# Patient Record
Sex: Male | Born: 1945 | Race: White | Hispanic: No | Marital: Single | Smoking: Former smoker
Health system: Southern US, Community
[De-identification: ages and names within clinical notes are randomized; demographics above are authoritative.]

## PROBLEM LIST (undated history)

## (undated) DIAGNOSIS — R1310 Dysphagia, unspecified: Secondary | ICD-10-CM

## (undated) DIAGNOSIS — C801 Malignant (primary) neoplasm, unspecified: Secondary | ICD-10-CM

## (undated) DIAGNOSIS — M1990 Unspecified osteoarthritis, unspecified site: Secondary | ICD-10-CM

## (undated) DIAGNOSIS — D649 Anemia, unspecified: Secondary | ICD-10-CM

## (undated) DIAGNOSIS — F419 Anxiety disorder, unspecified: Secondary | ICD-10-CM

## (undated) DIAGNOSIS — F32A Depression, unspecified: Secondary | ICD-10-CM

## (undated) DIAGNOSIS — F329 Major depressive disorder, single episode, unspecified: Secondary | ICD-10-CM

## (undated) HISTORY — DX: Anemia, unspecified: D64.9

## (undated) HISTORY — PX: WRIST FRACTURE SURGERY: SHX121

## (undated) HISTORY — PX: HEMORRHOID SURGERY: SHX153

## (undated) HISTORY — DX: Anxiety disorder, unspecified: F41.9

## (undated) HISTORY — PX: CT RADIATION THERAPY GUIDE: HXRAD513

## (undated) HISTORY — DX: Malignant (primary) neoplasm, unspecified: C80.1

## (undated) HISTORY — DX: Depression, unspecified: F32.A

## (undated) HISTORY — DX: Unspecified osteoarthritis, unspecified site: M19.90

## (undated) HISTORY — DX: Dysphagia, unspecified: R13.10

## (undated) HISTORY — PX: TONSILLECTOMY: SUR1361

## (undated) HISTORY — PX: LUNG REMOVAL, PARTIAL: SHX233

## (undated) HISTORY — DX: Major depressive disorder, single episode, unspecified: F32.9

## (undated) HISTORY — PX: HEAD & NECK WOUND REPAIR / CLOSURE: SUR1142

---
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
## 2012-11-19 DIAGNOSIS — N529 Male erectile dysfunction, unspecified: Secondary | ICD-10-CM | POA: Insufficient documentation

## 2012-11-19 DIAGNOSIS — F32A Depression, unspecified: Secondary | ICD-10-CM | POA: Insufficient documentation

## 2012-11-19 DIAGNOSIS — I10 Essential (primary) hypertension: Secondary | ICD-10-CM | POA: Insufficient documentation

## 2012-11-19 DIAGNOSIS — F1020 Alcohol dependence, uncomplicated: Secondary | ICD-10-CM | POA: Insufficient documentation

## 2012-11-19 DIAGNOSIS — J449 Chronic obstructive pulmonary disease, unspecified: Secondary | ICD-10-CM | POA: Insufficient documentation

## 2012-11-19 DIAGNOSIS — F329 Major depressive disorder, single episode, unspecified: Secondary | ICD-10-CM | POA: Insufficient documentation

## 2012-11-19 DIAGNOSIS — E039 Hypothyroidism, unspecified: Secondary | ICD-10-CM | POA: Insufficient documentation

## 2012-12-31 ENCOUNTER — Emergency Department: Payer: Self-pay | Admitting: Emergency Medicine

## 2012-12-31 LAB — CBC
HCT: 43 % (ref 40.0–52.0)
MCH: 28.8 pg (ref 26.0–34.0)
MCHC: 34.1 g/dL (ref 32.0–36.0)
MCV: 84 fL (ref 80–100)
RBC: 5.09 10*6/uL (ref 4.40–5.90)

## 2012-12-31 LAB — URINALYSIS, COMPLETE
Hyaline Cast: 4
Ketone: NEGATIVE
Leukocyte Esterase: NEGATIVE
Nitrite: NEGATIVE
Ph: 6 (ref 4.5–8.0)
RBC,UR: 1 /HPF (ref 0–5)

## 2012-12-31 LAB — COMPREHENSIVE METABOLIC PANEL
Alkaline Phosphatase: 248 U/L — ABNORMAL HIGH (ref 50–136)
Anion Gap: 8 (ref 7–16)
BUN: 6 mg/dL — ABNORMAL LOW (ref 7–18)
Co2: 25 mmol/L (ref 21–32)
Creatinine: 0.72 mg/dL (ref 0.60–1.30)
Glucose: 114 mg/dL — ABNORMAL HIGH (ref 65–99)
Osmolality: 259 (ref 275–301)
SGOT(AST): 93 U/L — ABNORMAL HIGH (ref 15–37)
SGPT (ALT): 81 U/L — ABNORMAL HIGH (ref 12–78)
Total Protein: 8.1 g/dL (ref 6.4–8.2)

## 2013-01-10 ENCOUNTER — Encounter: Payer: Self-pay | Admitting: Internal Medicine

## 2013-01-13 ENCOUNTER — Encounter: Payer: Self-pay | Admitting: Internal Medicine

## 2013-02-01 ENCOUNTER — Emergency Department: Payer: Self-pay | Admitting: Emergency Medicine

## 2013-02-01 ENCOUNTER — Ambulatory Visit: Payer: Self-pay | Admitting: Unknown Physician Specialty

## 2013-02-01 LAB — URINALYSIS, COMPLETE
Ketone: NEGATIVE
Nitrite: NEGATIVE
Specific Gravity: 1.009 (ref 1.003–1.030)
Squamous Epithelial: NONE SEEN
WBC UR: 183 /HPF (ref 0–5)

## 2013-02-01 LAB — TROPONIN I
Troponin-I: <0.02 ng/mL
Troponin-I: <0.02 ng/mL

## 2013-02-01 LAB — COMPREHENSIVE METABOLIC PANEL
Albumin: 3.5 g/dL (ref 3.4–5.0)
Anion Gap: 6 — ABNORMAL LOW (ref 7–16)
BUN: 10 mg/dL (ref 7–18)
Bilirubin,Total: 0.5 mg/dL (ref 0.2–1.0)
Calcium, Total: 9.4 mg/dL (ref 8.5–10.1)
Chloride: 95 mmol/L — ABNORMAL LOW (ref 98–107)
Creatinine: 0.95 mg/dL (ref 0.60–1.30)
Glucose: 115 mg/dL — ABNORMAL HIGH (ref 65–99)
Osmolality: 259 (ref 275–301)
Potassium: 3.6 mmol/L (ref 3.5–5.1)
SGPT (ALT): 42 U/L (ref 12–78)
Sodium: 129 mmol/L — ABNORMAL LOW (ref 136–145)
Total Protein: 7.1 g/dL (ref 6.4–8.2)

## 2013-02-01 LAB — CBC
HCT: 37.1 % — ABNORMAL LOW (ref 40.0–52.0)
HGB: 12.9 g/dL — ABNORMAL LOW (ref 13.0–18.0)
MCH: 29 pg (ref 26.0–34.0)
Platelet: 245 10*3/uL (ref 150–440)
RBC: 4.44 10*6/uL (ref 4.40–5.90)
RDW: 17.5 % — ABNORMAL HIGH (ref 11.5–14.5)
WBC: 10.2 10*3/uL (ref 3.8–10.6)

## 2013-02-03 LAB — URINE CULTURE

## 2013-02-07 ENCOUNTER — Ambulatory Visit: Payer: Self-pay | Admitting: Gastroenterology

## 2013-02-13 ENCOUNTER — Encounter: Payer: Self-pay | Admitting: Internal Medicine

## 2013-03-14 DIAGNOSIS — K7460 Unspecified cirrhosis of liver: Secondary | ICD-10-CM | POA: Insufficient documentation

## 2013-03-14 DIAGNOSIS — K769 Liver disease, unspecified: Secondary | ICD-10-CM | POA: Insufficient documentation

## 2013-03-14 DIAGNOSIS — B182 Chronic viral hepatitis C: Secondary | ICD-10-CM | POA: Insufficient documentation

## 2013-04-21 DIAGNOSIS — C3490 Malignant neoplasm of unspecified part of unspecified bronchus or lung: Secondary | ICD-10-CM | POA: Insufficient documentation

## 2013-05-05 DIAGNOSIS — C228 Malignant neoplasm of liver, primary, unspecified as to type: Secondary | ICD-10-CM | POA: Insufficient documentation

## 2013-05-05 DIAGNOSIS — C220 Liver cell carcinoma: Secondary | ICD-10-CM | POA: Insufficient documentation

## 2013-07-27 LAB — BASIC METABOLIC PANEL
Anion Gap: 5 — ABNORMAL LOW (ref 7–16)
BUN: 20 mg/dL — AB (ref 7–18)
CHLORIDE: 91 mmol/L — AB (ref 98–107)
Calcium, Total: 8.6 mg/dL (ref 8.5–10.1)
Co2: 27 mmol/L (ref 21–32)
Creatinine: 1.22 mg/dL (ref 0.60–1.30)
EGFR (African American): >60
EGFR (Non-African Amer.): >60
Glucose: 111 mg/dL — ABNORMAL HIGH (ref 65–99)
Osmolality: 251 (ref 275–301)
Potassium: 3.9 mmol/L (ref 3.5–5.1)
SODIUM: 123 mmol/L — AB (ref 136–145)

## 2013-07-27 LAB — CBC WITH DIFFERENTIAL/PLATELET
BASOS PCT: 0.8 %
Basophil #: 0.1 10*3/uL (ref 0.0–0.1)
EOS ABS: 0.1 10*3/uL (ref 0.0–0.7)
EOS PCT: 0.9 %
HCT: 38.1 % — ABNORMAL LOW (ref 40.0–52.0)
HGB: 12.8 g/dL — AB (ref 13.0–18.0)
LYMPHS ABS: 0.9 10*3/uL — AB (ref 1.0–3.6)
Lymphocyte %: 11.1 %
MCH: 27.5 pg (ref 26.0–34.0)
MCHC: 33.5 g/dL (ref 32.0–36.0)
MCV: 82 fL (ref 80–100)
Monocyte #: 0.8 x10 3/mm (ref 0.2–1.0)
Monocyte %: 10.4 %
NEUTROS ABS: 6.3 10*3/uL (ref 1.4–6.5)
Neutrophil %: 76.8 %
Platelet: 148 10*3/uL — ABNORMAL LOW (ref 150–440)
RBC: 4.64 10*6/uL (ref 4.40–5.90)
RDW: 17.6 % — ABNORMAL HIGH (ref 11.5–14.5)
WBC: 8.2 10*3/uL (ref 3.8–10.6)

## 2013-07-27 LAB — TROPONIN I

## 2013-07-28 ENCOUNTER — Inpatient Hospital Stay: Payer: Self-pay | Admitting: Internal Medicine

## 2013-07-28 LAB — SODIUM: Sodium: 129 mmol/L — ABNORMAL LOW (ref 136–145)

## 2013-07-28 LAB — TSH: THYROID STIMULATING HORM: 4.31 u[IU]/mL

## 2013-07-28 LAB — SODIUM, URINE, RANDOM: SODIUM, URINE RANDOM: 19 mmol/L (ref 20–110)

## 2013-07-28 LAB — HEMOGLOBIN: HGB: 11.8 g/dL — ABNORMAL LOW (ref 13.0–18.0)

## 2013-07-28 LAB — OSMOLALITY, URINE: Osmolality: 135 mOsm/kg

## 2013-09-11 ENCOUNTER — Emergency Department: Payer: Self-pay | Admitting: Emergency Medicine

## 2013-11-14 ENCOUNTER — Ambulatory Visit: Payer: Self-pay | Admitting: Gastroenterology

## 2013-11-29 ENCOUNTER — Ambulatory Visit: Payer: Self-pay | Admitting: Urology

## 2013-12-20 ENCOUNTER — Ambulatory Visit: Payer: Self-pay | Admitting: Gastroenterology

## 2013-12-26 ENCOUNTER — Ambulatory Visit: Payer: Self-pay | Admitting: Gastroenterology

## 2014-01-02 ENCOUNTER — Ambulatory Visit: Payer: Self-pay | Admitting: Pain Medicine

## 2014-01-02 LAB — BASIC METABOLIC PANEL
Anion Gap: 6 — ABNORMAL LOW (ref 7–16)
BUN: 5 mg/dL — AB (ref 7–18)
CALCIUM: 8.5 mg/dL (ref 8.5–10.1)
CO2: 28 mmol/L (ref 21–32)
Chloride: 103 mmol/L (ref 98–107)
Creatinine: 0.79 mg/dL (ref 0.60–1.30)
EGFR (African American): >60
EGFR (Non-African Amer.): >60
Glucose: 100 mg/dL — ABNORMAL HIGH (ref 65–99)
Osmolality: 271 (ref 275–301)
Potassium: 3.7 mmol/L (ref 3.5–5.1)
SODIUM: 137 mmol/L (ref 136–145)

## 2014-01-02 LAB — HEPATIC FUNCTION PANEL A (ARMC)
ALT: 37 U/L (ref 12–78)
AST: 41 U/L — AB (ref 15–37)
Albumin: 3.6 g/dL (ref 3.4–5.0)
Alkaline Phosphatase: 82 U/L
Bilirubin, Direct: 0.2 mg/dL (ref 0.00–0.20)
Bilirubin,Total: 0.5 mg/dL (ref 0.2–1.0)
Total Protein: 7.7 g/dL (ref 6.4–8.2)

## 2014-01-02 LAB — MAGNESIUM: Magnesium: 1.8 mg/dL

## 2014-01-02 LAB — SEDIMENTATION RATE: ERYTHROCYTE SED RATE: 18 mm/h (ref 0–20)

## 2014-01-16 ENCOUNTER — Ambulatory Visit: Payer: Self-pay | Admitting: Pain Medicine

## 2014-01-29 ENCOUNTER — Ambulatory Visit: Payer: Self-pay | Admitting: Pain Medicine

## 2014-02-15 ENCOUNTER — Ambulatory Visit: Payer: Self-pay | Admitting: Pain Medicine

## 2014-02-18 DIAGNOSIS — B182 Chronic viral hepatitis C: Secondary | ICD-10-CM | POA: Insufficient documentation

## 2014-02-18 DIAGNOSIS — K5730 Diverticulosis of large intestine without perforation or abscess without bleeding: Secondary | ICD-10-CM | POA: Insufficient documentation

## 2014-03-02 ENCOUNTER — Ambulatory Visit: Payer: Self-pay | Admitting: Pain Medicine

## 2014-03-27 ENCOUNTER — Ambulatory Visit: Payer: Self-pay | Admitting: Pain Medicine

## 2014-03-29 ENCOUNTER — Ambulatory Visit: Payer: Self-pay | Admitting: Urology

## 2014-03-29 LAB — CBC WITH DIFFERENTIAL/PLATELET
BASOS ABS: 0.1 10*3/uL (ref 0.0–0.1)
Basophil %: 1.3 %
EOS PCT: 2.5 %
Eosinophil #: 0.2 10*3/uL (ref 0.0–0.7)
HCT: 36.9 % — ABNORMAL LOW (ref 40.0–52.0)
HGB: 11.5 g/dL — AB (ref 13.0–18.0)
LYMPHS ABS: 0.5 10*3/uL — AB (ref 1.0–3.6)
LYMPHS PCT: 7.7 %
MCH: 26.3 pg (ref 26.0–34.0)
MCHC: 31.2 g/dL — ABNORMAL LOW (ref 32.0–36.0)
MCV: 84 fL (ref 80–100)
Monocyte #: 0.7 x10 3/mm (ref 0.2–1.0)
Monocyte %: 10.5 %
NEUTROS PCT: 78 %
Neutrophil #: 5.2 10*3/uL (ref 1.4–6.5)
PLATELETS: 292 10*3/uL (ref 150–440)
RBC: 4.38 10*6/uL — ABNORMAL LOW (ref 4.40–5.90)
RDW: 15.4 % — ABNORMAL HIGH (ref 11.5–14.5)
WBC: 6.7 10*3/uL (ref 3.8–10.6)

## 2014-03-29 LAB — BASIC METABOLIC PANEL
Anion Gap: 7 (ref 7–16)
BUN: 5 mg/dL — ABNORMAL LOW (ref 7–18)
CALCIUM: 8.8 mg/dL (ref 8.5–10.1)
CHLORIDE: 102 mmol/L (ref 98–107)
CO2: 28 mmol/L (ref 21–32)
Creatinine: 0.77 mg/dL (ref 0.60–1.30)
GLUCOSE: 108 mg/dL — AB (ref 65–99)
Osmolality: 272 (ref 275–301)
Potassium: 4.4 mmol/L (ref 3.5–5.1)
Sodium: 137 mmol/L (ref 136–145)

## 2014-04-19 ENCOUNTER — Ambulatory Visit: Payer: Self-pay | Admitting: Pain Medicine

## 2014-06-21 ENCOUNTER — Ambulatory Visit: Payer: Self-pay | Admitting: Family Medicine

## 2014-08-29 IMAGING — CT CT ABDOMEN AND PELVIS WITHOUT AND WITH CONTRAST
2 of 10 series · 10 of 46 positions shown, 16 images · IV contrast (isovue)
Comparison: Abdominal MRI 02/07/2013, abdominal ultrasound
02/01/2013 and CT urogram 01/01/2013.

CLINICAL DATA: One episode of gross hematuria 2 or 3 weeks ago.
History of prostate, lung (and liver? ) cancer.

EXAM:
CT ABDOMEN AND PELVIS WITHOUT AND WITH CONTRAST
TECHNIQUE: Multidetector CT imaging of the abdomen and pelvis was performed
following the standard protocol before and following the bolus
administration of intravenous contrast.
CONTRAST:  100 ml Isovue 370.

[Series 7: cor hematuria > 45 wo · coronal · 0.79mm/px · 2 of 153 slices shown, 3 images]
[im 51/153  soft-tissue]
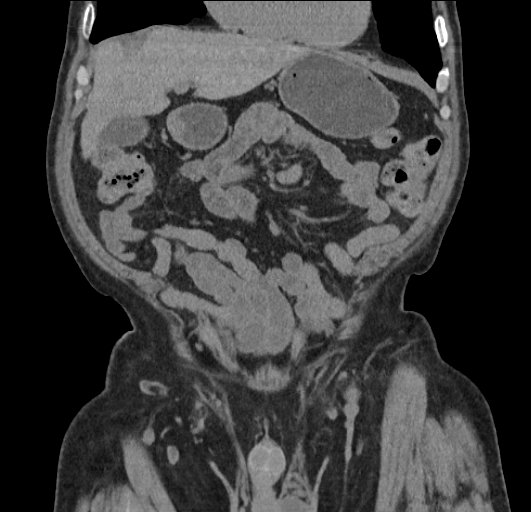
[im 51/153  bone]
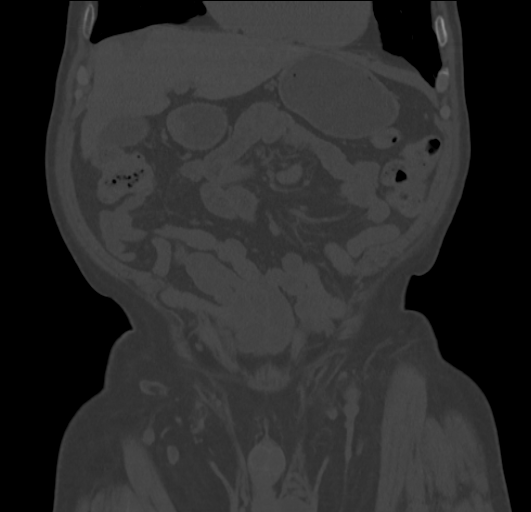
[im 102/153  soft-tissue]
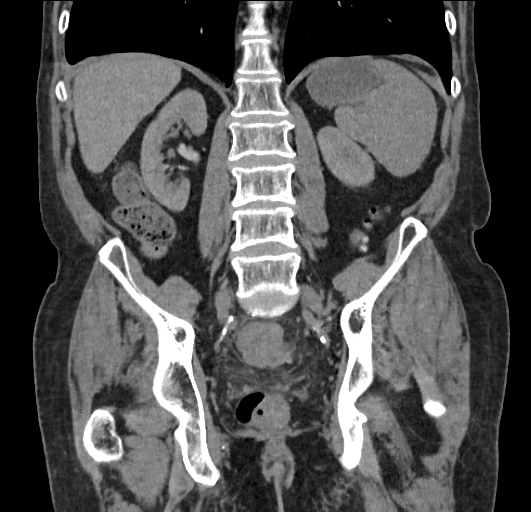

[Series 12: delay · axial · delayed · 0.87mm/px · z∈[-954,-620]mm · 8 of 87 slices shown, 13 images]
[im 10/87  soft-tissue]
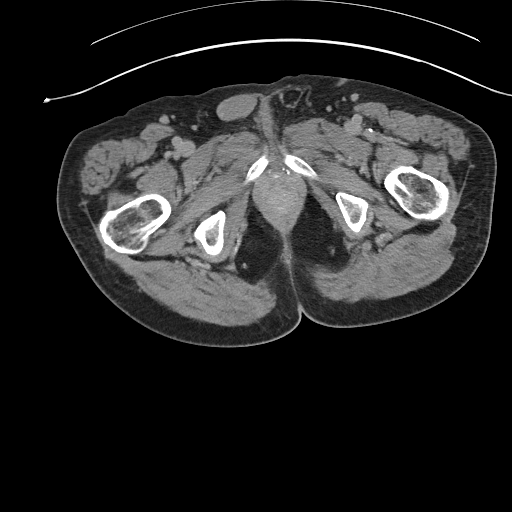
[im 10/87  bone]
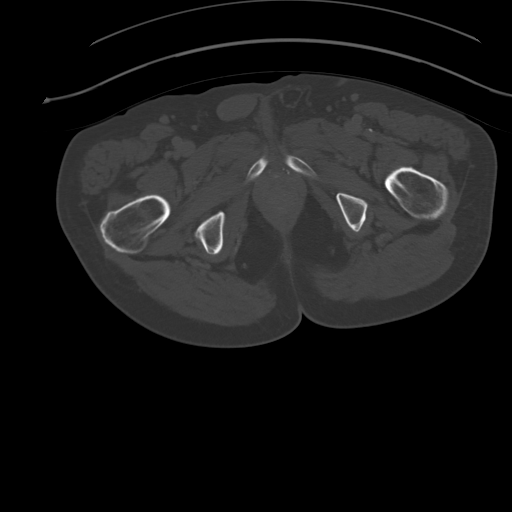
[im 20/87  soft-tissue]
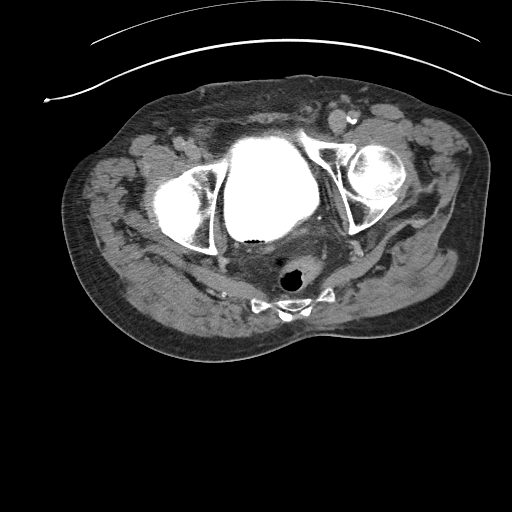
[im 29/87  soft-tissue]
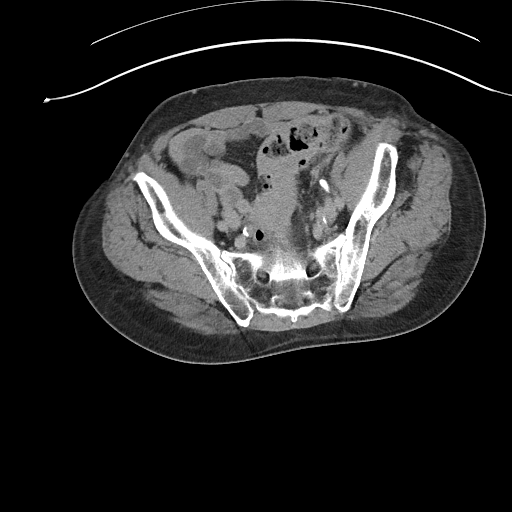
[im 39/87  soft-tissue]
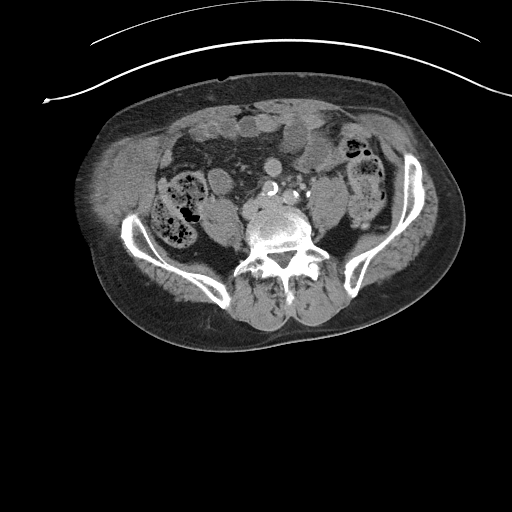
[im 48/87  soft-tissue]
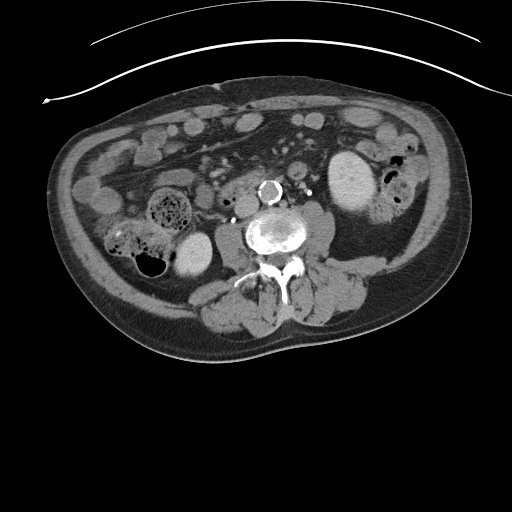
[im 48/87  lung]
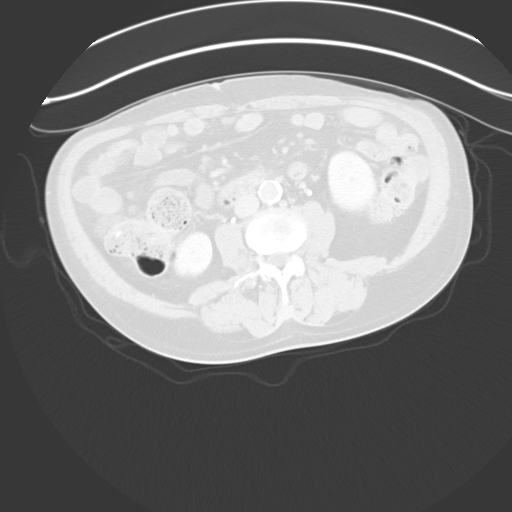
[im 58/87  soft-tissue]
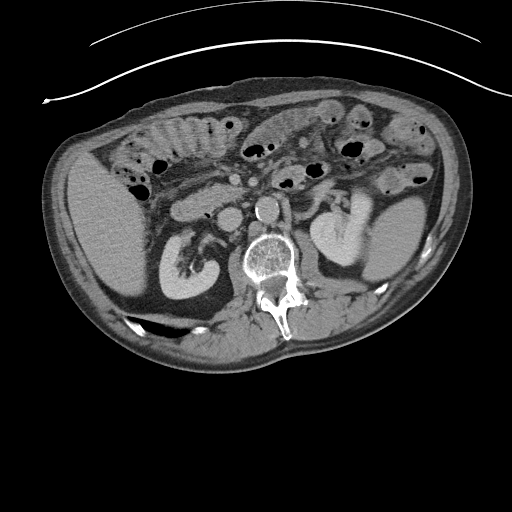
[im 58/87  lung]
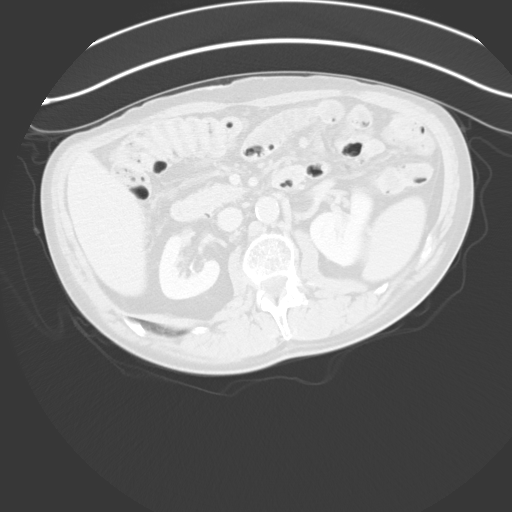
[im 67/87  soft-tissue]
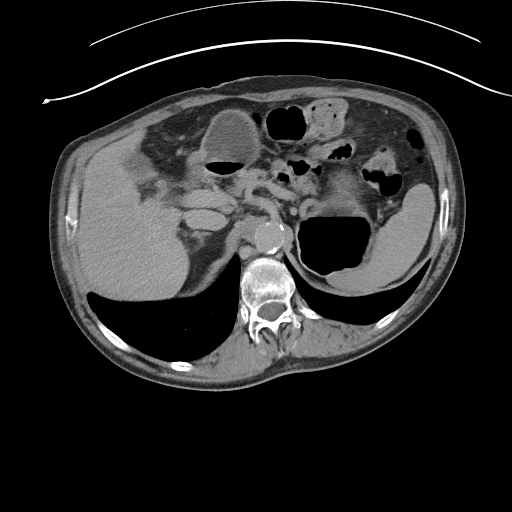
[im 67/87  lung]
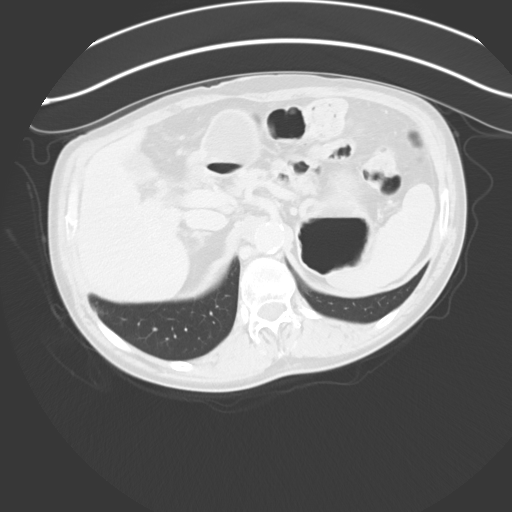
[im 77/87  soft-tissue]
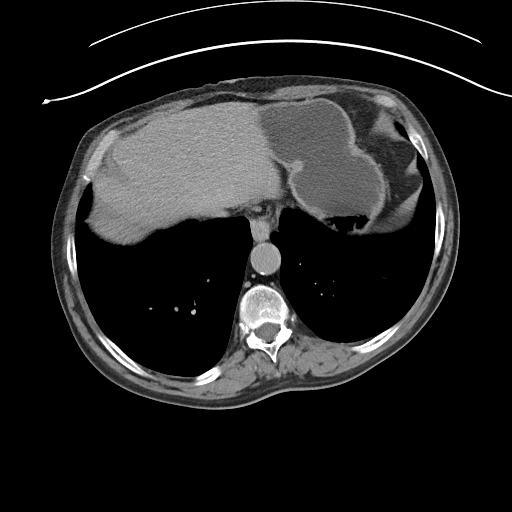
[im 77/87  lung]
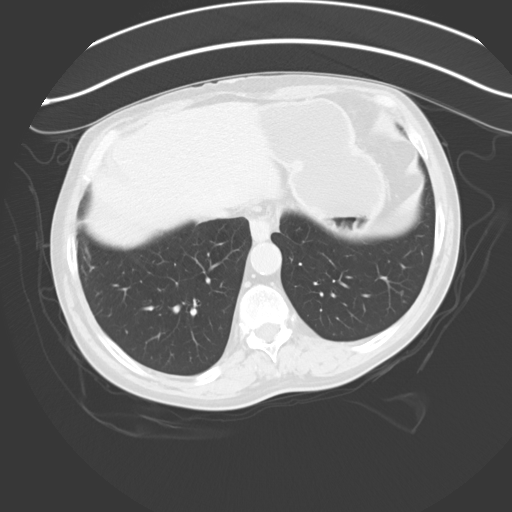

[10 of 46 positions shown; findings below may reference images not displayed]

FINDINGS: Lung bases: There is stable linear scarring laterally at the right
lung base and minimal subpleural nodularity in the left lower lobe.
No significant pleural or pericardial effusion is present.

Kidneys / Ureters / Bladder: Pre-contrast images demonstrate no
renal, ureteral or bladder calculi. The pre contrast images
demonstrate symmetric high density within the renal collecting
systems, ureters and bladder consistent with contrast material,
possibly from a test dose. Delayed images resultant segmental
visualization of the ureters which are normal in caliber. No upper
tract urothelial lesions are identified. Air is present within the
lumen of the urinary bladder. There is irregular thickening of the
superior walls of the bladder. There is suspicion of a colovesical
fistula between the sigmoid colon and bladder dome, best seen on the
reformatted images.

Other: Low-density lesion centrally in the right hepatic lobe with
peripheral enhancement has decreased in size compared with the prior
MRI, measuring approximately 2.4 x 1.6 cm on image 14. This appears
partially calcified on the precontrast images, and there is
surrounding low density within the liver. No new or enlarging liver
lesions are identified.

The gallbladder, biliary system, pancreas, spleen and adrenal glands
appear stable without significant findings. Aortoiliac
atherosclerosis and postsurgical changes related to prior
prostatectomy are noted. There is no abdominal pelvic
lymphadenopathy.

The stomach, small bowel, appendix and proximal colon appear normal.
Compared with the CT performed last [REDACTED], there is progressive
diffuse thickening of the walls of the sigmoid colon associated with
pericolonic inflammatory changes. As noted above, there appears to
be a small amount of extraluminal air inferiorly suggesting a
colovesical fistula. There is no free intraperitoneal air or pelvic
abscess. There is no evidence of bowel obstruction.

Bones / Musculoskeletal: No acute or worrisome osseous findings.
IMPRESSION: 1. Progressive chronic sigmoid colon wall thickening with
surrounding inflammatory change most consistent with chronic or
recurrent sigmoid diverticulitis. There is now adjacent thickening
of the walls of the dome of the urinary bladder and air in the
bladder lumen worrisome for development of a colovesical fistula.
Underlying colonic or bladder neoplasm cannot be excluded but is not
strongly suspected.
2. These findings presumably contribute to the patient's hematuria.
No other explanation for hematuria identified.
3. The kidneys in ureters appear normal.
4. Previously demonstrated central hepatic lesion appears slightly
smaller, potentially a treated metastasis. No new liver lesions
identified.

## 2014-10-02 IMAGING — CR LEFT WRIST - COMPLETE 3+ VIEW
1 series · 4 of 4 positions shown · non-contrast
Comparison: None.

CLINICAL DATA: Chronic left wrist pain.

EXAM:
LEFT WRIST - COMPLETE 3+ VIEW

[Series 4: x wrist pa left · 0.14mm/px · 4 of 4 slices shown]
[im 1/4]
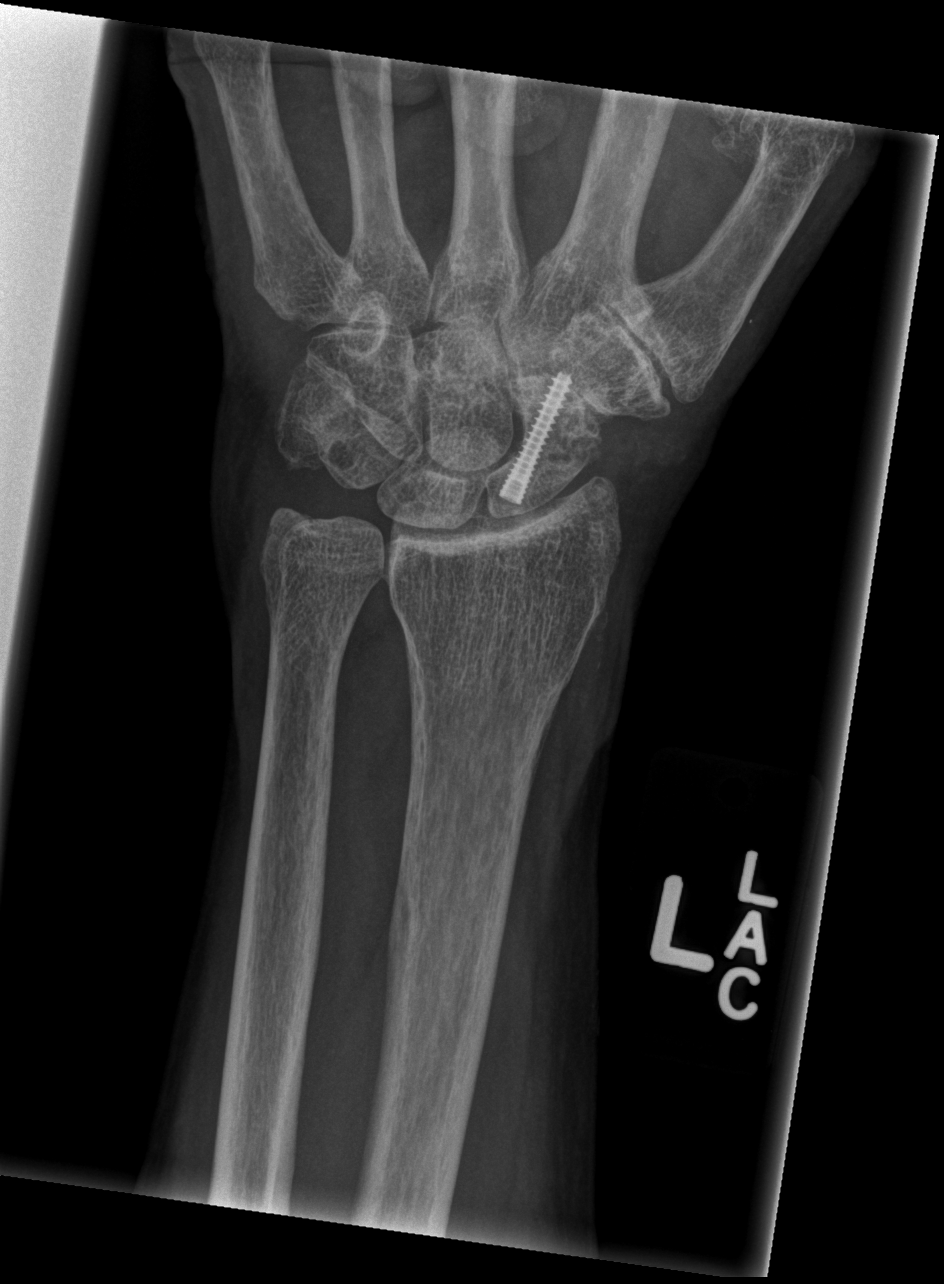
[im 2/4]
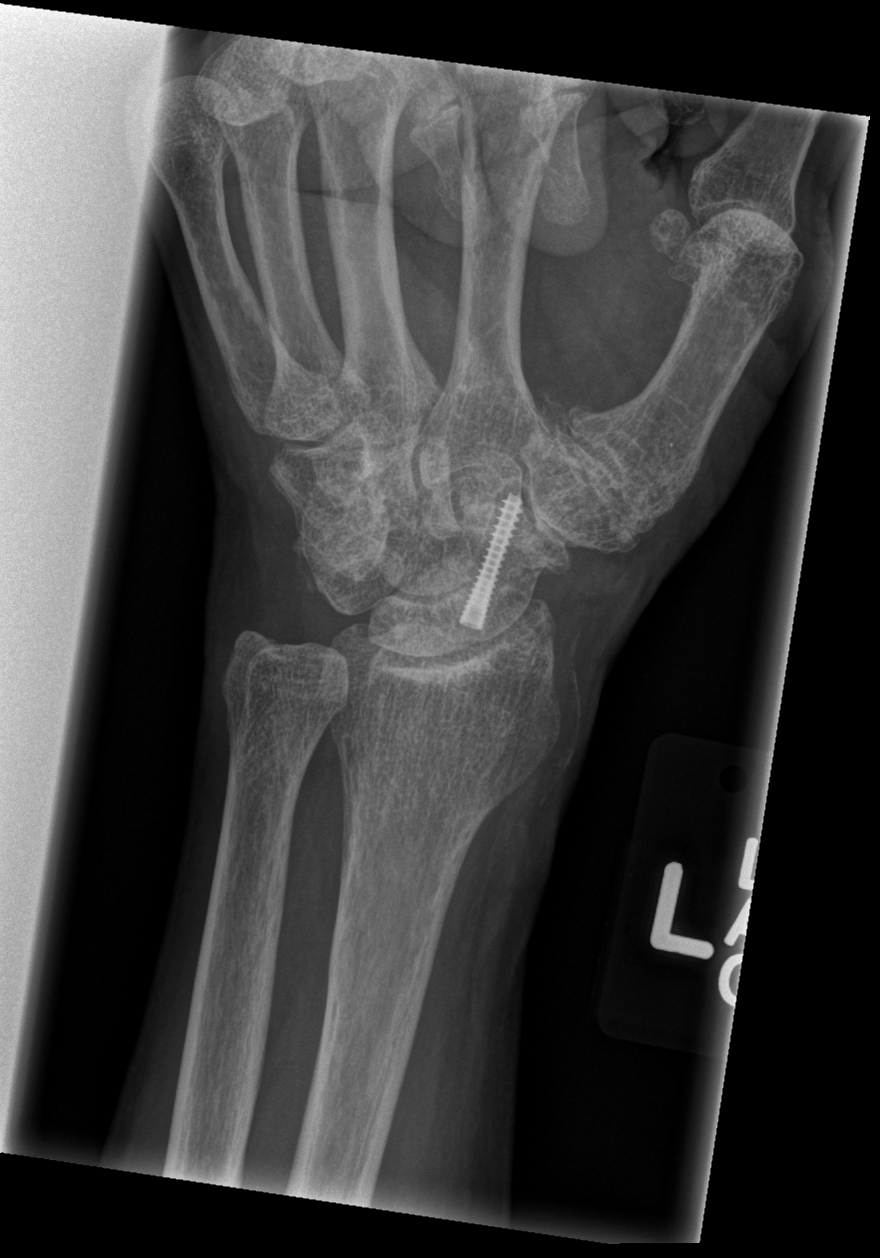
[im 3/4]
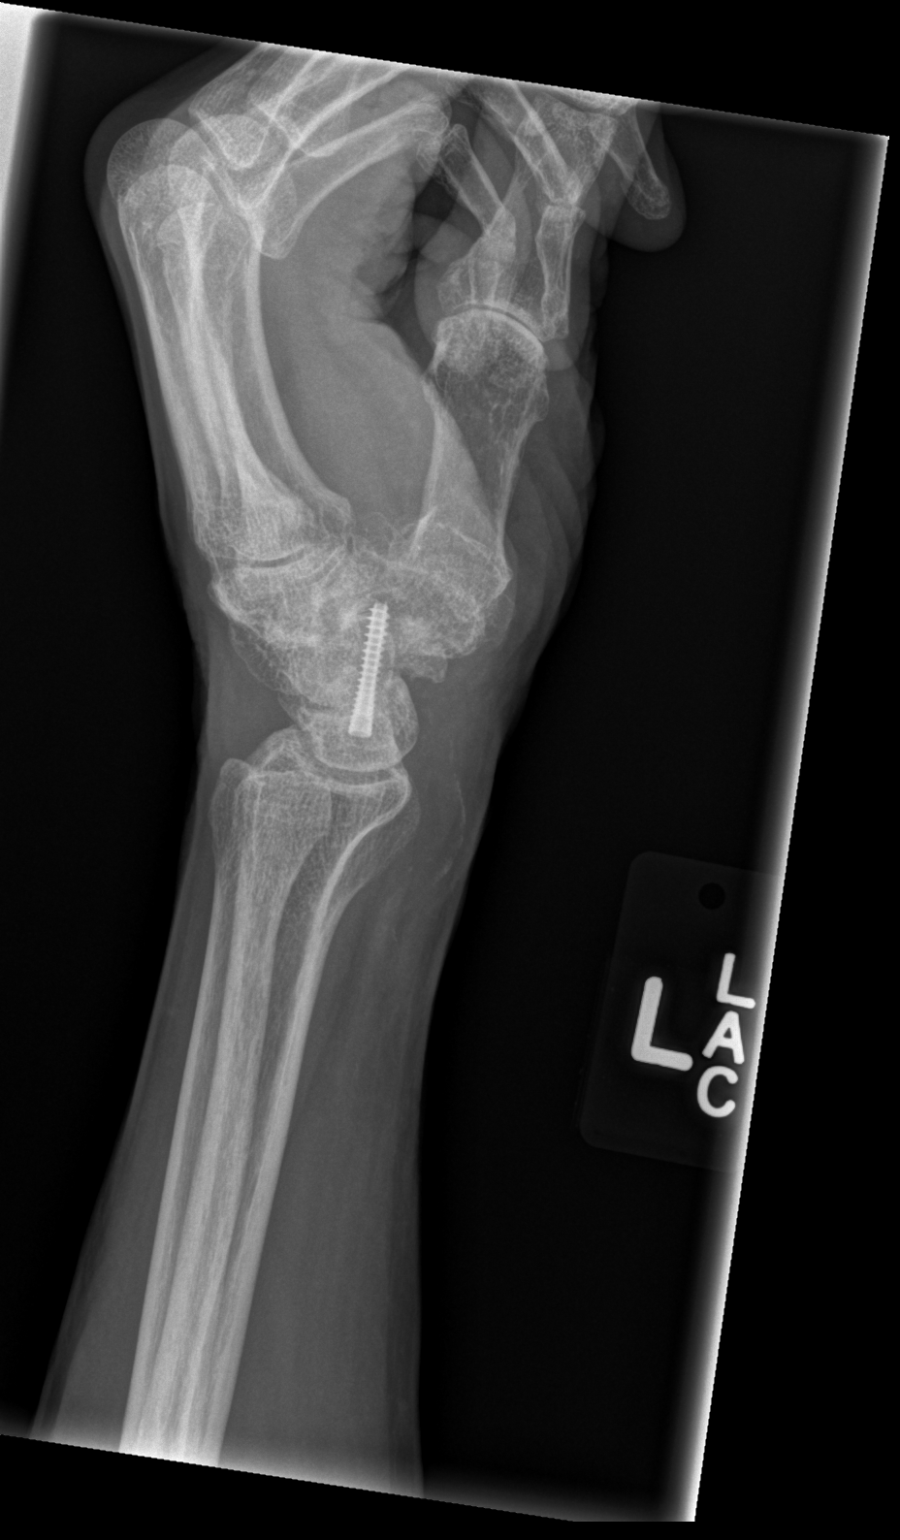
[im 4/4]
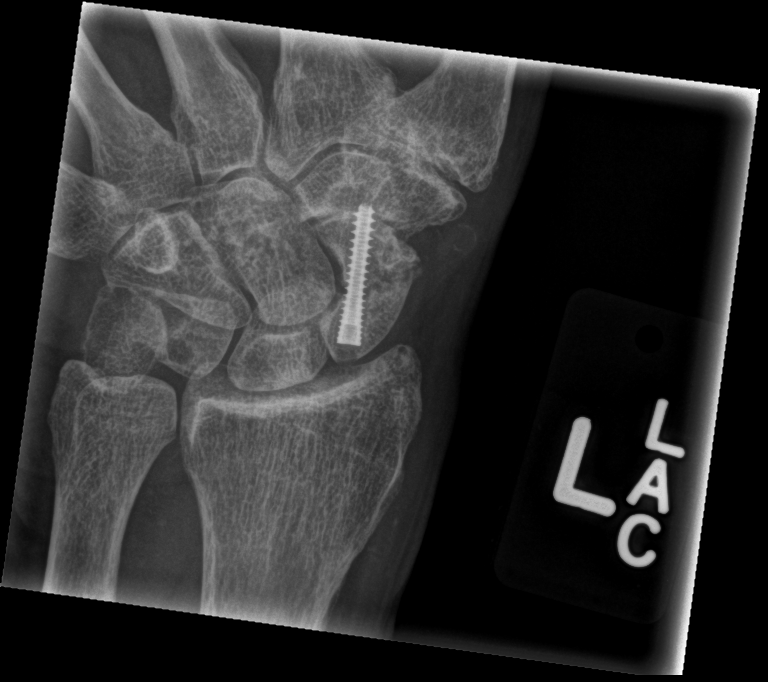

[4 of 4 positions shown; findings below may reference images not displayed]

FINDINGS: No acute fracture or dislocation is noted. Surgical screw is noted
in the scaphoid bone. Narrowing and osteophyte formation of the
first carpometacarpal joint is noted consistent with osteoarthritis.
No soft tissue abnormality is noted.
IMPRESSION: Osteoarthritis of the first carpometacarpal joint. No acute fracture
or dislocation is noted.

## 2014-10-06 NOTE — Discharge Summary (Signed)
PATIENT NAME:  Tyrone Dominguez, OZBURN MR#:  [id number] DATE OF BIRTH:  May 05, 1946  DATE OF ADMISSION:  07/28/2013 DATE OF DISCHARGE:  07/28/2013  ADMITTING DIAGNOSIS: Hyponatremia.   DISCHARGE DIAGNOSES: 1.  Dizziness, resolved.  2.  Hyponatremia, likely excess water intake  as well as possibly hydrochlorothiazide-related, better with normal saline solution supplementation.  3.  Dyspnea resolved.  4.  History of hypothyroidism.  5.  Hepatocellular carcinoma and lung cancer, status post wedge resection in the past.   DISCHARGE CONDITION: Stable.   DISCHARGE MEDICATIONS: The patient is to continue l-thyroxine 25 mcg p.o. daily, Prozac 20 mg p.o. daily, oxycodone extended release 50 mg p.o. twice daily, vitamin D3 1200 units once daily, OxyContin 20 mg p.o. twice daily, naproxen 500 mg twice daily as needed, lisinopril 10 mg p.o. daily.  The patient was advised not to take HCTZ.   HOME OXYGEN: None.   DIET: Low salt, regular consistency. The patient was advised also not to drink lots of water, just as much as he absolutely has to have so he would not feel dehydrated.    FOLLOWUP APPOINTMENT: With Dr. Alfonso Ramus in 2 days after discharge.   CONSULTANTS: Care management, social work.   RADIOLOGIC STUDIES: None.   HISTORY AND HOSPITAL COURSE: The patient is a 69 year old Caucasian male with past medical history significant for history of hepatocellular carcinoma, history of lung carcinoma, status post question right-sided wedge resection, hypothyroidism, hypertension, who presented to the hospital with dizziness. Please refer to Dr. Samantha Crimes admission note on July 28, 2013.  Apparently he recently had a change of medications from oxycodone extended-release to MS Contin equivalent.  He took his first dose of MS Contin and became short of breath as well as had dizziness.  He also said that felt as if his  blood pressure was low. The symptoms were transient and resolved and he arrived to the Emergency Room  for further evaluation. On workup it appeared that he had hyponatremia. His physical exam was unremarkable. His lab data done in the Emergency Room revealed sodium level of 123, BUN 20, glucose 111; otherwise BMP was unremarkable. Troponin level was less than 0.02. TSH was normal at 4.31. White blood cell count was 8.2, hemoglobin 11.8 and platelet count was 148, absolute neutrophil count was normal at 6.3. Sodium in urine random was 19, osmolarity urine random was 135 as compared osmolarity calculated of in blood of 251. EKG showed normal sinus rhythm at 73 beats per minute, normal axis, no acute ST-T changes were noted. The patient was admitted to the hospital for further evaluation. Since it was felt that that the patient was a little dehydrated, he was given normal saline infusion, after which his sodium level was rechecked and it was at his baseline at 129.  His dizziness, lightheadedness as well as shortness of breath also resolved, on the day of discharge, he felt satisfactory, did not complain of any significant discomfort. His vitals were stable with temperature of 97.7, pulse was in 50s, respiration rate was 18 to 20, blood pressure ranging from 671 systolic to 245 and diastolics 80D to 98P. O2 sats were 95% to 96% on room air at rest.   DISCHARGE INSTRUCTIONS:  He was advised to call his primary care physician, Dr. Alfonso Ramus, who gives him opiates and ask him to change MS Contin back to long-acting oxycodone.   TIME SPENT: 40 minutes.   ____________________________ Theodoro Grist, MD rv:cs D: 07/30/2013 17:56:08 ET T: 07/30/2013 18:44:44 ET JOB#: [id number]  cc: Theodoro Grist, MD, <Dictator> [facility], Dr. Thereasa Solo MD ELECTRONICALLY SIGNED 08/15/2013 21:34

## 2014-10-06 NOTE — H&P (Signed)
PATIENT NAME:  Tyrone Dominguez, Tyrone Dominguez MR#:  [id number] DATE OF BIRTH:  [birth year]  DATE OF ADMISSION:  07/28/2013  REFERRING PHYSICIAN: Dr. Jacqualine Code.   PRIMARY CARE PHYSICIAN: Nonlocal at [facility].   CHIEF COMPLAINT: Presenting with dizziness.   HISTORY OF PRESENT ILLNESS: A 69 year old Caucasian gentleman with past medical history of hepatocellular carcinoma, lung carcinoma status post right-sided wedge resection, hypothyroidism and hypertension, presenting with dizziness. Apparently had a recent change of medications for pain from oxycodone extended release 20 mg p.o. b.i.d., changed to the equivalent of morphine 30 mg p.o. b.i.d. He took his first dose of morphine. After taking the medication, he felt short of breath and dizziness described as lightheadedness. Also states that he felt like his blood pressure was low. Symptoms transiently resolved. He presented to the Emergency Department. During his initial workup, found to be hyponatremic. States that he has not had any difficulty with p.o. intake. He does actually drink large amounts of free water. Denies any nausea, vomiting, diarrhea. Currently, he is without complaints.   REVIEW OF SYSTEMS:  CONSTITUTIONAL: Denies fever, fatigue, weakness.  EYES: Denied blurred vision, double vision, eye pain.  EARS, NOSE, THROAT: Denies tinnitus, ear pain, hearing loss.  RESPIRATORY: Denies cough, wheeze, shortness of breath   CARDIOVASCULAR: Denies chest pain, palpitations, edema.  GASTROINTESTINAL: Denies nausea, vomiting, diarrhea, abdominal pain.  GENITOURINARY: No dysuria or hematuria.  ENDOCRINE: Denies nocturia or thyroid problems.  HEMATOLOGIC AND LYMPHATIC: Denies easy bruising or bleeding.  SKIN: Denies rash or lesions.  MUSCULOSKELETAL: Denies current pain in neck, back, shoulder, knees, hips or arthritic symptoms.  NEUROLOGIC: Denies paralysis or paresthesias.  PSYCHIATRIC: Deniesanxiety/depressive symptoms.   Otherwise, full review of systems  performed by me is negative.   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   SOCIAL HISTORY: Remote tobacco usage. Remote alcohol usage. Has not had a drink since October when diagnosed with hepatocellular carcinoma. Denies any drug usage.   FAMILY HISTORY: Positive for diabetes and hypertension.   ALLERGIES: No known drug allergies.   HOME MEDICATIONS: Include ProAir 90 mcg inhalation every 4 hours as needed for shortness of breath, vitamin B complex 1 tablet p.o. daily, Qvar 40 mcg inhalation 1 puff b.i.d., Colace 100 mg p.o. b.i.d. which he has not been taking secondary to cost issues, fluoxetine 20 mg p.o. daily, hydrochlorothiazide 25 mg p.o. daily, lactobacillus acidophilus 10 billion cells per capsule 3 capsules p.o. daily, levothyroxine 25 mcg p.o. daily, lisinopril 10 mg p.o. daily, OxyContin 20 mg p.o. b.i.d., oxycodone 15 mg p.o. q.4 hours as needed for pain   PHYSICAL EXAMINATION:  VITAL SIGNS: Temperature 98.2, heart rate 72, respirations 18, blood pressure 106/59, saturating 96% on room air. Weight 77.1 kg, BMI 25.9.  GENERAL: Well-nourished, well-developed, Caucasian gentleman, currently in no acute distress.   HEAD: Normocephalic, atraumatic.  EYES: Pupils equal, round and reactive to light. Extraocular muscles intact. No scleral icterus.  MOUTH: Moist mucosal membranes. Dentition intact. No abscess noted.  EARS, NOSE, THROAT: Throat clear without exudates. No external lesions.  NECK: Supple. No thyromegaly. No nodules. No JVD.  PULMONARY: Clear to auscultation bilaterally without wheezes, rubs, rhonchi. No use of accessory muscles. Good respiratory effort.  CHEST: Nontender to palpation.  CARDIOVASCULAR: S1, S2, regular rate and rhythm. No murmurs,  rubs or gallops.  No edema. Pedal pulses  2+ bilaterally.  GASTROINTESTINAL: Soft, nontender, nondistended. No masses. Positive bowel sounds. No hepatosplenomegaly.  MUSCULOSKELETAL: No swelling, clubbing, edema. Range of motion full in all extremities.  NEUROLOGIC: Cranial nerves II through XII intact. No gross focal neurological deficits. Sensation intact. Reflexes intact.  SKIN: No ulcerations, lesions, rash, cyanosis. Skin warm, dry. Turgor intact.  PSYCHIATRIC: Mood and affect within normal limits. The patient is awake, alert, oriented x 3. Insight and judgment intact.   LABORATORY DATA: Sodium 123, potassium 3.9, chloride 91, bicarb 27, BUN 20, creatinine 1.22, glucose 111. Troponin I less than 0.02. WBC 8.2, hemoglobin 12.8, platelets 148.   ASSESSMENT AND PLAN: A 69 year old Caucasian gentleman with history of hepatocellular carcinoma as well as lung cancer status post wedge resection, presenting with dizziness after changes of his pain medications. All symptoms had resolved by the he presented to the Emergency Department; however, during initial workup found to be hyponatremic.  1. Hyponatremia: Appears to be euvolemic. Sodium deficit of 462.2 mEq. He has received 500 mL normal saline thus far. Start normal saline 100 mL an hour to correct sodium level to 139 in approximately 24 hours. Will check urine sodium as well as urine osmolalities to help determine the etiology. Check a TSH. Continue holding his hydrochlorothiazide as this is likely a confounding factor.  2. Chronic pain: Switch back to OxyContin 20 mg p.o. q.i.d. and oxycodone 15 mg q.4 hours p.r.n. for pain.  3. Hypothyroidism: Check TSH. Continue with Synthroid therapy.  4. History of hepatocellular carcinoma as well as lung cancer.  5. Venous thromboembolism prophylaxis with heparin subcutaneous.   The patient is FULL CODE.   TIME SPENT: 45 minutes.   ____________________________ [name], MD dkh:gb D: 07/28/2013  04:02:13 ET T: 07/28/2013 04:54:15 ET JOB#: [id number]  cc: [name], MD, <Dictator> [name] MD ELECTRONICALLY SIGNED 07/28/2013 21:06

## 2014-12-25 IMAGING — CR DG KNEE COMPLETE 4+V*L*
1 series · 4 of 4 positions shown · non-contrast
Comparison: None.

CLINICAL DATA: Fell down steps

EXAM:
LEFT KNEE - COMPLETE 4+ VIEW

[Series 1: dxr knee lt comp with obliques · 0.14mm/px · 4 of 4 slices shown]
[im 1/4]
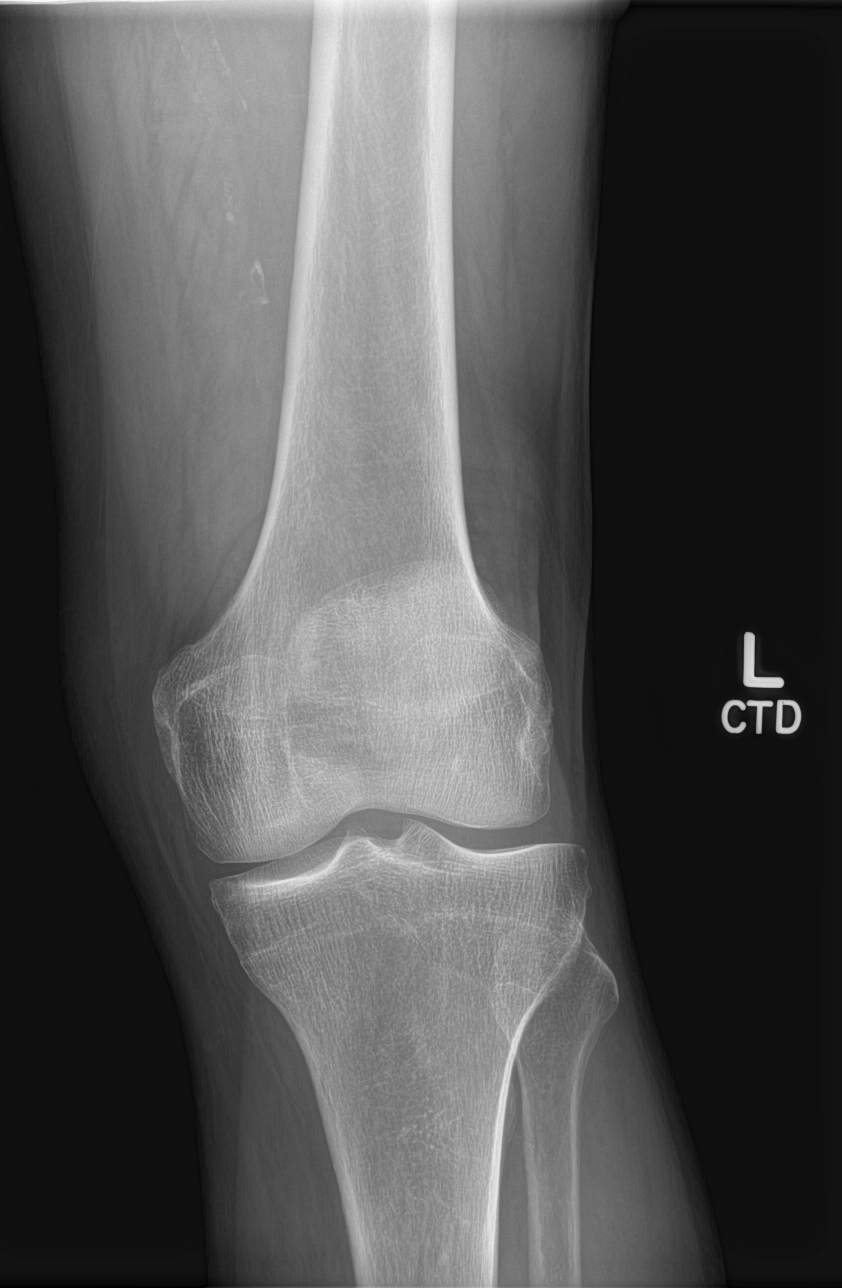
[im 2/4]
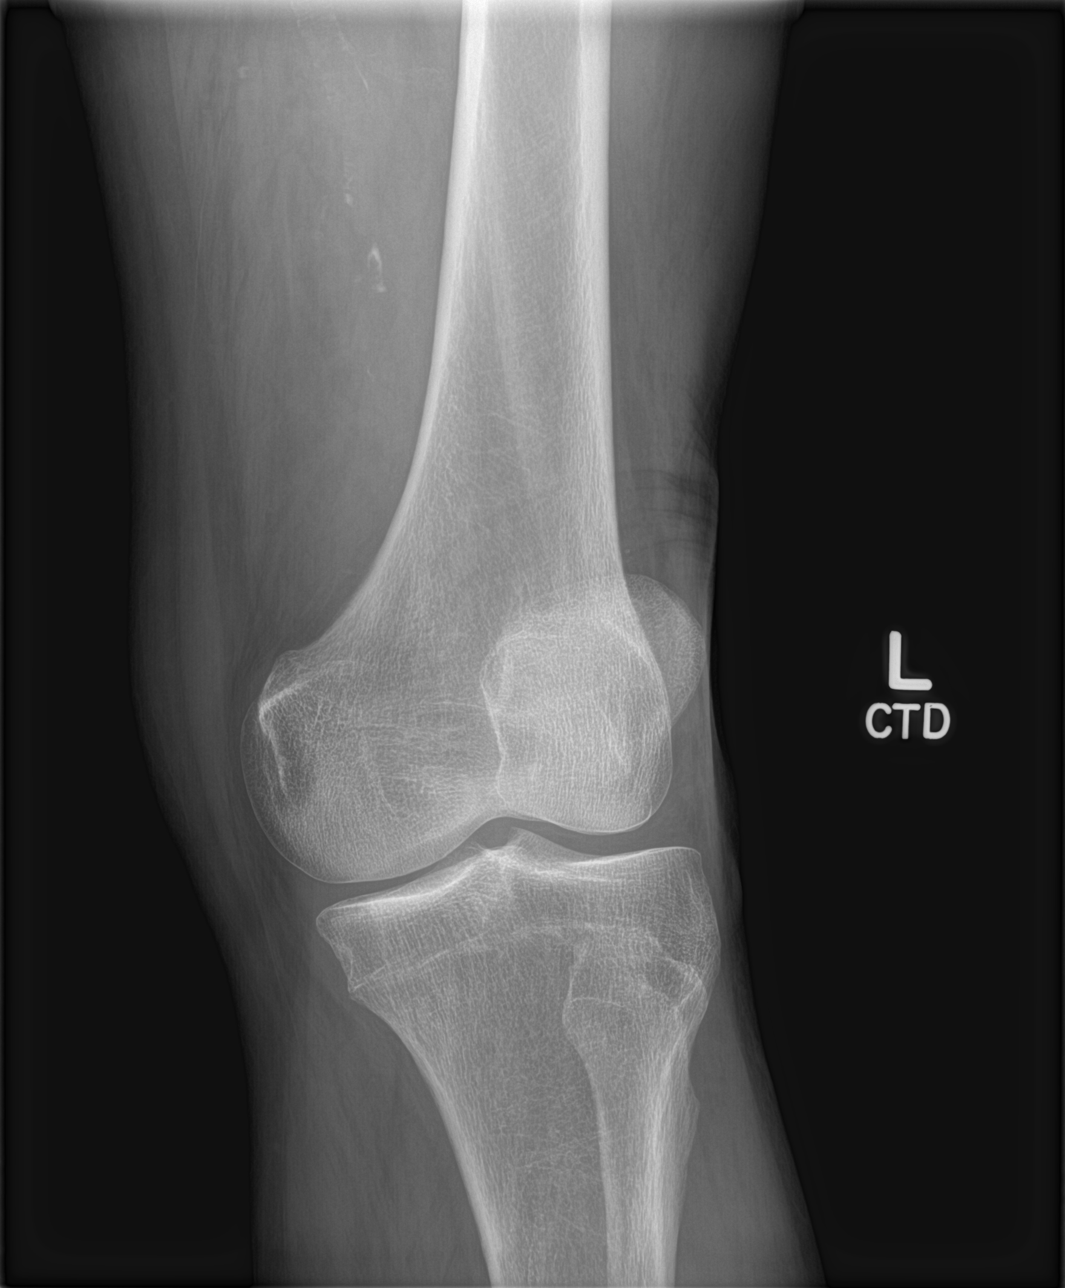
[im 3/4]
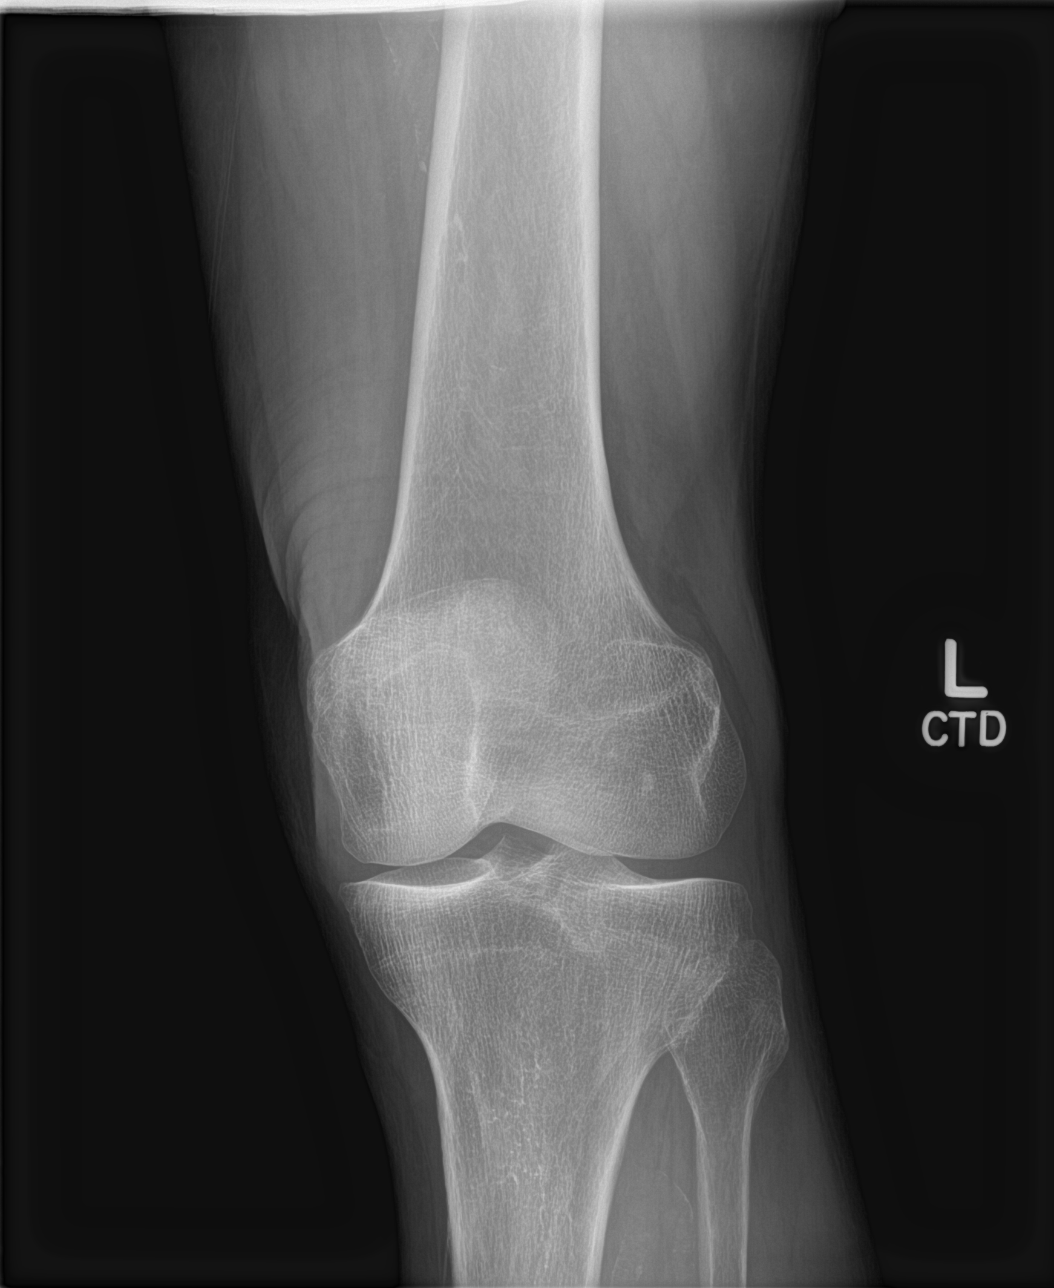
[im 4/4]
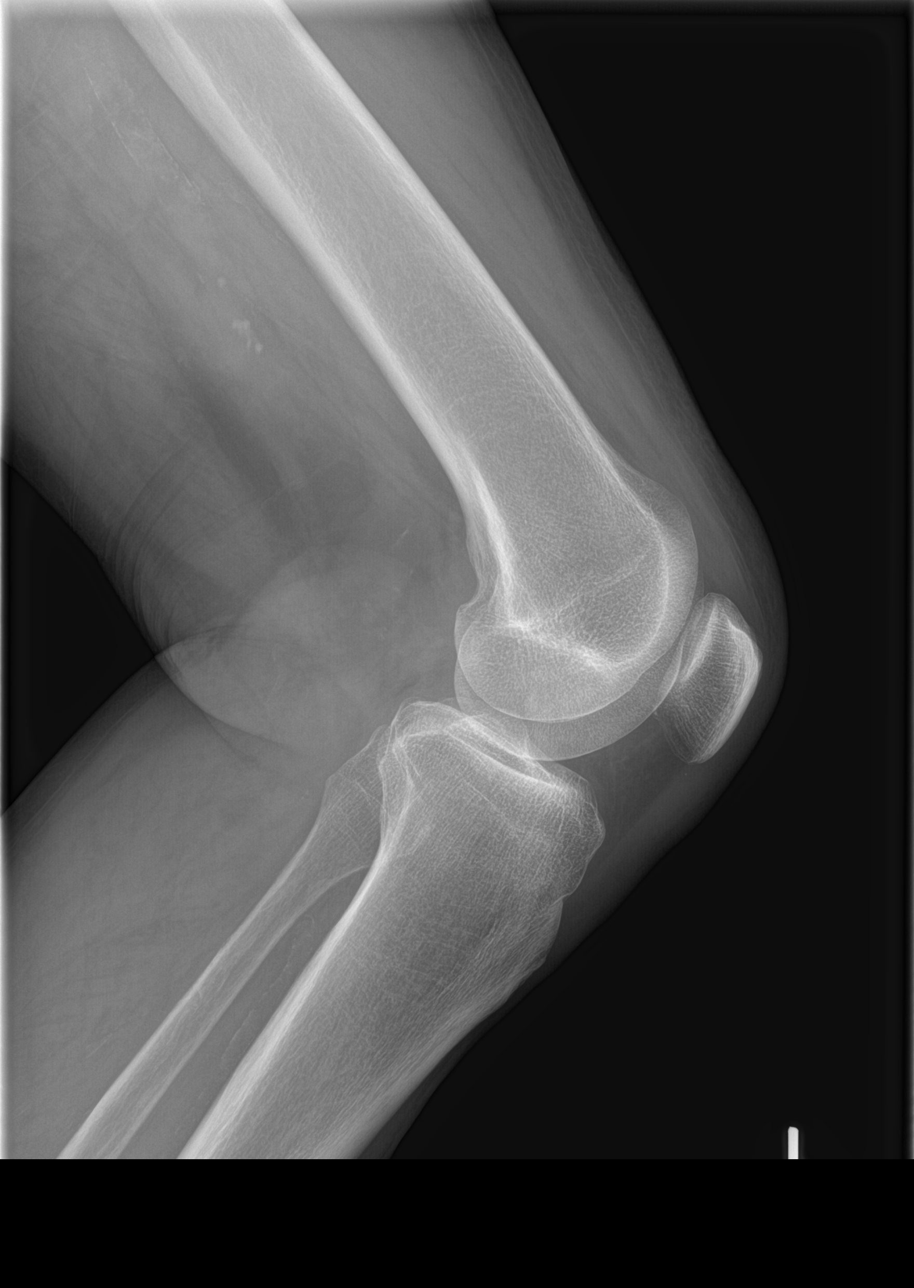

[4 of 4 positions shown; findings below may reference images not displayed]

FINDINGS: There is no evidence of fracture, dislocation, or joint effusion.
There is no evidence of arthropathy or other focal bone abnormality.
Soft tissues are unremarkable.
IMPRESSION: Negative.

## 2015-03-18 ENCOUNTER — Encounter: Payer: Self-pay | Admitting: Pain Medicine

## 2015-03-18 ENCOUNTER — Ambulatory Visit: Payer: Medicare Other | Attending: Pain Medicine | Admitting: Pain Medicine

## 2015-03-18 VITALS — BP 117/76 | HR 77 | Temp 98.0°F | Resp 18 | Ht 67.0 in | Wt 180.0 lb

## 2015-03-18 DIAGNOSIS — C61 Malignant neoplasm of prostate: Secondary | ICD-10-CM | POA: Diagnosis not present

## 2015-03-18 DIAGNOSIS — G893 Neoplasm related pain (acute) (chronic): Secondary | ICD-10-CM

## 2015-03-18 DIAGNOSIS — Z87891 Personal history of nicotine dependence: Secondary | ICD-10-CM | POA: Insufficient documentation

## 2015-03-18 DIAGNOSIS — M542 Cervicalgia: Secondary | ICD-10-CM | POA: Diagnosis present

## 2015-03-18 DIAGNOSIS — M47812 Spondylosis without myelopathy or radiculopathy, cervical region: Secondary | ICD-10-CM

## 2015-03-18 DIAGNOSIS — M545 Low back pain, unspecified: Secondary | ICD-10-CM

## 2015-03-18 DIAGNOSIS — M25562 Pain in left knee: Secondary | ICD-10-CM | POA: Insufficient documentation

## 2015-03-18 DIAGNOSIS — F1290 Cannabis use, unspecified, uncomplicated: Secondary | ICD-10-CM | POA: Insufficient documentation

## 2015-03-18 DIAGNOSIS — M13812 Other specified arthritis, left shoulder: Secondary | ICD-10-CM | POA: Diagnosis not present

## 2015-03-18 DIAGNOSIS — C787 Secondary malignant neoplasm of liver and intrahepatic bile duct: Secondary | ICD-10-CM | POA: Diagnosis not present

## 2015-03-18 DIAGNOSIS — G8929 Other chronic pain: Secondary | ICD-10-CM | POA: Diagnosis not present

## 2015-03-18 DIAGNOSIS — M12819 Other specific arthropathies, not elsewhere classified, unspecified shoulder: Secondary | ICD-10-CM | POA: Diagnosis not present

## 2015-03-18 DIAGNOSIS — F1120 Opioid dependence, uncomplicated: Secondary | ICD-10-CM | POA: Diagnosis not present

## 2015-03-18 DIAGNOSIS — G894 Chronic pain syndrome: Secondary | ICD-10-CM | POA: Diagnosis not present

## 2015-03-18 DIAGNOSIS — M47892 Other spondylosis, cervical region: Secondary | ICD-10-CM | POA: Insufficient documentation

## 2015-03-18 DIAGNOSIS — M25561 Pain in right knee: Secondary | ICD-10-CM | POA: Insufficient documentation

## 2015-03-18 DIAGNOSIS — Z79891 Long term (current) use of opiate analgesic: Secondary | ICD-10-CM

## 2015-03-18 DIAGNOSIS — Z79899 Other long term (current) drug therapy: Secondary | ICD-10-CM

## 2015-03-18 DIAGNOSIS — M25511 Pain in right shoulder: Secondary | ICD-10-CM | POA: Diagnosis present

## 2015-03-18 DIAGNOSIS — M25512 Pain in left shoulder: Secondary | ICD-10-CM | POA: Diagnosis present

## 2015-03-18 DIAGNOSIS — M19012 Primary osteoarthritis, left shoulder: Secondary | ICD-10-CM

## 2015-03-18 MED ORDER — OXYCODONE HCL 15 MG PO TABS: 15.0000 mg | ORAL_TABLET | Freq: Four times a day (QID) | ORAL | Status: DC | PRN

## 2015-03-18 MED ORDER — OXYCODONE HCL 15 MG PO TABS
15.0000 mg | ORAL_TABLET | Freq: Four times a day (QID) | ORAL | Status: DC | PRN
Start: 2015-03-18 — End: 2015-06-19

## 2015-03-18 NOTE — Progress Notes (Signed)
Safety precautions to be maintained throughout the outpatient stay will include: orient to surroundings, keep bed in low position, maintain call bell within reach at all times, provide assistance with transfer out of bed and ambulation.  

## 2015-03-18 NOTE — Progress Notes (Signed)
Oxycodone pill count #16/120

## 2015-03-19 DIAGNOSIS — M545 Low back pain, unspecified: Secondary | ICD-10-CM | POA: Insufficient documentation

## 2015-03-19 DIAGNOSIS — G893 Neoplasm related pain (acute) (chronic): Secondary | ICD-10-CM | POA: Insufficient documentation

## 2015-03-19 DIAGNOSIS — M25562 Pain in left knee: Secondary | ICD-10-CM

## 2015-03-19 DIAGNOSIS — Z79899 Other long term (current) drug therapy: Secondary | ICD-10-CM | POA: Insufficient documentation

## 2015-03-19 DIAGNOSIS — Z79891 Long term (current) use of opiate analgesic: Secondary | ICD-10-CM | POA: Insufficient documentation

## 2015-03-19 DIAGNOSIS — G8929 Other chronic pain: Secondary | ICD-10-CM | POA: Insufficient documentation

## 2015-03-19 DIAGNOSIS — G894 Chronic pain syndrome: Secondary | ICD-10-CM | POA: Insufficient documentation

## 2015-03-19 DIAGNOSIS — M25561 Pain in right knee: Secondary | ICD-10-CM

## 2015-03-19 NOTE — Progress Notes (Signed)
Patient's Name: [facility]. MRN: [id number] DOB: 1946/04/14 DOS: 03/18/2015 Primary Care Physician: Theotis Burrow, MD Location: [facility] Note by: Kathlen Brunswick. Dossie Arbour, M.D, DABA, DABAPM, DABPM, DABIPP, FIPP  Primary Reason(s) for Visit: Encounter for Medication Management. CC: Neck Pain and Shoulder Pain  HPI:   Mr. Sooy is a 69 y.o. year old, male patient, seen today for evaluation and possible adjustment of analgesic pharmacological regimen.The Patient is scheduled to return today for evaluation of analgesic regimen and possible medication adjustment(s). The patient complains primarily of Neck Pain and Shoulder Pain    Symptoms: Patient confirms worsening when therapy is absent or decreased. Pharmacotherapy Review: Side-effects or Adverse reactions: None reported. Effectiveness: Described as relatively effective, allowing for increase in activities of daily living (ADL). Onset of action: Within normal limits for current pharmacological therapy. Duration: Within normal limits for medication. Peak effect: Timing and results are as within normal expected parameters. [place] PMP: Compliant with practice rules and regulations. DST: Compliant with practice rules and regulations. Lab work: No new labs ordered by our practice. Treatment compliance: Compliant. Substance Use Disorder (SUD) Risk Level: Low Planned course of action: Continue therapy as is. Allergies: Allergies  Allergen Reactions  . Ciprofloxacin Hives   Meds: Outpatient Encounter Prescriptions as of 03/18/2015  Medication Sig  . albuterol (PROVENTIL HFA;VENTOLIN HFA) 108 (90 BASE) MCG/ACT inhaler Inhale 2 puffs into the lungs every 6 (six) hours as needed for wheezing or shortness of breath.  Marland Kitchen albuterol (PROVENTIL) (2.5 MG/3ML) 0.083% nebulizer solution Take 2.5 mg by nebulization every 6 (six) hours as needed for wheezing or shortness of breath.  . Cholecalciferol  (VITAMIN D-3) 1000 UNITS CAPS Take 3 capsules by mouth daily.  . diphenhydrAMINE (SOMINEX) 25 MG tablet Take 50 mg by mouth at bedtime as needed for sleep.  . ferrous sulfate 325 (65 FE) MG tablet Take 325 mg by mouth daily with breakfast.  . FLUoxetine (PROZAC) 20 MG capsule Take 20 mg by mouth daily.  Marland Kitchen gabapentin (NEURONTIN) 800 MG tablet Take 800 mg by mouth 3 (three) times daily.  . Ledipasvir-Sofosbuvir (HARVONI) 90-400 MG TABS Take 1 tablet by mouth daily.  Marland Kitchen levothyroxine (SYNTHROID, LEVOTHROID) 25 MCG tablet Take 25 mcg by mouth daily before breakfast.  . lisinopril (PRINIVIL,ZESTRIL) 20 MG tablet Take 20 mg by mouth daily.  . Magnesium 400 MG CAPS Take 400 mg by mouth daily as needed.  . Melatonin 10 MG CAPS Take 1 tablet by mouth at bedtime.  Marland Kitchen omeprazole (PRILOSEC) 40 MG capsule Take 40 mg by mouth daily.  Marland Kitchen oxyCODONE (ROXICODONE) 15 MG immediate release tablet Take 1 tablet (15 mg total) by mouth every 6 (six) hours as needed for pain.  . [DISCONTINUED] oxyCODONE (ROXICODONE) 15 MG immediate release tablet Take 15 mg by mouth every 6 (six) hours as needed for pain.  . [DISCONTINUED] oxyCODONE (ROXICODONE) 15 MG immediate release tablet Take 1 tablet (15 mg total) by mouth every 6 (six) hours as needed for pain.  . [DISCONTINUED] oxyCODONE (ROXICODONE) 15 MG immediate release tablet Take 1 tablet (15 mg total) by mouth every 6 (six) hours as needed for pain.  Marland Kitchen oxyCODONE (ROXICODONE) 15 MG immediate release tablet Take 1 tablet (15 mg total) by mouth every 6 (six) hours as needed for pain.  Marland Kitchen oxyCODONE (ROXICODONE) 15 MG immediate release tablet Take 1 tablet (15 mg total) by mouth every 6 (six) hours as needed for pain.   No facility-administered encounter medications on file as  of 03/18/2015.   Requested Prescriptions   Signed Prescriptions Disp Refills  . oxyCODONE (ROXICODONE) 15 MG immediate release tablet 120 tablet 0    Sig: Take 1 tablet (15 mg total) by mouth every 6 (six)  hours as needed for pain.  Marland Kitchen oxyCODONE (ROXICODONE) 15 MG immediate release tablet 120 tablet 0    Sig: Take 1 tablet (15 mg total) by mouth every 6 (six) hours as needed for pain.  Marland Kitchen oxyCODONE (ROXICODONE) 15 MG immediate release tablet 120 tablet 0    Sig: Take 1 tablet (15 mg total) by mouth every 6 (six) hours as needed for pain.   ROS: Constitutional: Afebrile, no chills, well hydrated and well nourished Gastrointestinal: negative Musculoskeletal:negative Neurological: negative Behavioral/Psych: negative PFSH: Medical:  Mr. Birnie  has a past medical history of Anxiety; Cancer ([place]); Arthritis; Depression; and Anemia. Family:  Family History  Problem Relation Age of Onset  . Cancer Mother   . Diabetes Father   . Lung disease Father   . Parkinson's disease Father   . Hypertension Father    Surgical:  Past Surgical History  Procedure Laterality Date  . Wrist fracture surgery    . Head & neck wound repair / closure      100 strutures  . Hemorrhoid surgery      1982  . Tonsillectomy      1957  . Lung removal, partial      2014  . Ct radiation therapy guide      left lung and liver 2015    Social:  Social History  Substance Use Topics  . Smoking status: Former Smoker -- 2.50 packs/day for 50 years    Types: Cigarettes    Quit date: 08/16/2006  . Smokeless tobacco: Not on file  . Alcohol Use: No     Comment: former drinker 03/15/13   Tobacco:  History  Smoking status  . Former Smoker -- 2.50 packs/day for 50 years  . Types: Cigarettes  . Quit date: 08/16/2006  Smokeless tobacco  . Not on file   Alcohol:  History  Alcohol Use No    Comment: former drinker 03/15/13   Drug:  History  Drug Use No   Physical Exam: Vitals:  Today's Vitals   03/18/15 1134 03/18/15 1144  BP: 117/76   Pulse: 77   Temp: 98 F (36.7 C)   TempSrc: Oral   Resp: 18   Height: '5\' 7"'$  (1.702 m)   Weight: 180 lb (81.647 kg)   SpO2: 99%   PainSc:  6    Calculated BMI: Body  mass index is 28.19 kg/(m^2). Exam:  General appearance: alert, cooperative, appears older than stated age, cachectic, no distress and pale Eyes: conjunctivae/corneas clear. PERRL, EOM's intact. Fundi benign. Neck: no adenopathy, no carotid bruit, no JVD, supple, symmetrical, trachea midline and thyroid not enlarged, symmetric, no tenderness/mass/nodules Back: symmetric, no curvature. ROM normal. No CVA tenderness. Lungs: No evidence respiratory distress, no audible rales or ronchi and no use of accessory muscles of respiration Extremities: extremities normal, atraumatic, no cyanosis or edema Pulses: 2+ and symmetric Skin: Skin color, texture, turgor normal. No rashes or lesions Neurologic: Grossly normal  Assessment: Encounter Diagnosis:  Primary Diagnosis: Chronic pain syndrome [G89.4] Problem List:  Patient Active Problem List   Diagnosis Date Noted  . Chronic pain syndrome 03/19/2015  . Arthropathy of left shoulder 03/19/2015  . Uncomplicated opioid dependence ([place]) 03/19/2015  . Medical marijuana use 03/19/2015  . Bilateral chronic knee pain 03/19/2015  .  Long term prescription opiate use 03/19/2015  . Cancer related pain 03/19/2015  . Cervicalgia 03/19/2015  . Cervical spondylosis without myelopathy 03/19/2015  . Chronic low back pain 03/19/2015  . Colon, diverticulosis 02/18/2014  . Pain in shoulder 07/10/2013  . Arthralgia of shoulder 07/10/2013  . Hepatocellular carcinoma ([place]) 05/05/2013  . Primary malignant neoplasm of liver ([place]) 05/05/2013  . Malignant neoplastic disease ([place]) 04/21/2013  . Cancer of lung ([place]) 04/21/2013  . 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 Chronic hepatitis C virus infection ([place]) 03/14/2013  . Disease of liver 03/14/2013  . Hepatic cirrhosis ([place]) 03/14/2013  . Alcoholism ([place]) 11/19/2012  . Chronic obstructive pulmonary disease ([place]) 11/19/2012  . Clinical depression 11/19/2012  . ED (erectile dysfunction) of organic origin  11/19/2012  . H/O malignant neoplasm of prostate 11/19/2012  . BP (high blood pressure) 11/19/2012  . Adult hypothyroidism 11/19/2012  . Abdominal pain 11/21/2010  . Malignant neoplasm of prostate ([place]) 01/02/2010   Plan: Dailey was seen today for neck pain and shoulder pain.  Diagnoses and all orders for this visit:  Chronic pain syndrome  Arthropathy of left shoulder  Uncomplicated opioid dependence ([place])  Medical marijuana use  Bilateral chronic knee pain  Long term prescription opiate use  Cancer related pain  Cervicalgia  Cervical spondylosis without myelopathy  Chronic low back pain  Other orders -     Discontinue: oxyCODONE (ROXICODONE) 15 MG immediate release tablet; Take 1 tablet (15 mg total) by mouth every 6 (six) hours as needed for pain. -     Discontinue: oxyCODONE (ROXICODONE) 15 MG immediate release tablet; Take 1 tablet (15 mg total) by mouth every 6 (six) hours as needed for pain. -     oxyCODONE (ROXICODONE) 15 MG immediate release tablet; Take 1 tablet (15 mg total) by mouth every 6 (six) hours as needed for pain. -     oxyCODONE (ROXICODONE) 15 MG immediate release tablet; Take 1 tablet (15 mg total) by mouth every 6 (six) hours as needed for pain. -     oxyCODONE (ROXICODONE) 15 MG immediate release tablet; Take 1 tablet (15 mg total) by mouth every 6 (six) hours as needed for pain.   Patient instructions:  There are no Patient Instructions on file for this visit.  Medications discontinued today:  Medications Discontinued During This Encounter  Medication Reason  . oxyCODONE (ROXICODONE) 15 MG immediate release tablet Reorder  . oxyCODONE (ROXICODONE) 15 MG immediate release tablet Reorder  . oxyCODONE (ROXICODONE) 15 MG immediate release tablet Reorder    Medications administered today:  Mr. Metallo had no medications administered during this visit.  Images attached to encounter:  No images are attached to the encounter.  Scans attached to  encounter:  No scans are attached to the encounter.

## 2015-04-04 ENCOUNTER — Other Ambulatory Visit: Payer: Self-pay | Admitting: Pain Medicine

## 2015-04-18 ENCOUNTER — Emergency Department
Admission: EM | Admit: 2015-04-18 | Discharge: 2015-04-18 | Disposition: A | Discharge status: Home / Self Care | Payer: Medicare Other | Attending: Emergency Medicine | Admitting: Emergency Medicine

## 2015-04-18 ENCOUNTER — Encounter: Payer: Self-pay | Admitting: Emergency Medicine

## 2015-04-18 DIAGNOSIS — M25562 Pain in left knee: Secondary | ICD-10-CM

## 2015-04-18 DIAGNOSIS — Y9289 Other specified places as the place of occurrence of the external cause: Secondary | ICD-10-CM | POA: Diagnosis not present

## 2015-04-18 DIAGNOSIS — S8992XA Unspecified injury of left lower leg, initial encounter: Secondary | ICD-10-CM | POA: Insufficient documentation

## 2015-04-18 DIAGNOSIS — X58XXXA Exposure to other specified factors, initial encounter: Secondary | ICD-10-CM | POA: Diagnosis not present

## 2015-04-18 DIAGNOSIS — Y9389 Activity, other specified: Secondary | ICD-10-CM | POA: Diagnosis not present

## 2015-04-18 DIAGNOSIS — Y998 Other external cause status: Secondary | ICD-10-CM | POA: Insufficient documentation

## 2015-04-18 DIAGNOSIS — Z79899 Other long term (current) drug therapy: Secondary | ICD-10-CM | POA: Diagnosis not present

## 2015-04-18 DIAGNOSIS — G8929 Other chronic pain: Secondary | ICD-10-CM | POA: Insufficient documentation

## 2015-04-18 DIAGNOSIS — Z87891 Personal history of nicotine dependence: Secondary | ICD-10-CM | POA: Diagnosis not present

## 2015-04-18 MED ORDER — TRIAMCINOLONE ACETONIDE 40 MG/ML IJ SUSP
40.0000 mg | Freq: Once | INTRAMUSCULAR | Status: AC
  Administered 2015-04-18: 40 mg via INTRA_ARTICULAR
  Filled 2015-04-18: qty 1

## 2015-04-18 MED ORDER — LIDOCAINE HCL (PF) 1 % IJ SOLN
5.0000 mL | Freq: Once | INTRAMUSCULAR | Status: AC
  Administered 2015-04-18: 5 mL
  Filled 2015-04-18: qty 5

## 2015-04-18 NOTE — ED Provider Notes (Signed)
[facility] Emergency Department Provider Note  ____________________________________________  Time seen: Approximately 3:57 PM  I have reviewed the triage vital signs and the nursing notes.   HISTORY  Chief Complaint Knee Pain    HPI [facility] Tyrone Dominguez. is a 69 y.o. male with history of left knee pain. He has been told that he has a meniscal injury. He reports twisting his knee about 3 hours ago and having acute pain and swelling.   He is on chronic pain medicine through the pain clinic, and has a hx of liver cancer, and Hepatitis C.   He is able to walk. He denies pain to the left ankle or left hip.   Past Medical History  Diagnosis Date  . Anxiety   . Cancer ([place])   . Arthritis     neck  . Depression   . Anemia     Diagnosed Sept 16    Patient Active Problem List   Diagnosis Date Noted  . Chronic pain syndrome 03/19/2015  . Arthropathy of left shoulder 03/19/2015  . Uncomplicated opioid dependence ([place]) 03/19/2015  . Medical marijuana use 03/19/2015  . Bilateral chronic knee pain 03/19/2015  . Long term prescription opiate use 03/19/2015  . Cancer related pain 03/19/2015  . Cervicalgia 03/19/2015  . Cervical spondylosis without myelopathy 03/19/2015  . Chronic low back pain 03/19/2015  . Colon, diverticulosis 02/18/2014  . Pain in shoulder 07/10/2013  . Arthralgia of shoulder 07/10/2013  . Hepatocellular carcinoma ([place]) 05/05/2013  . Primary malignant neoplasm of liver ([place]) 05/05/2013  . Malignant neoplastic disease ([place]) 04/21/2013  . Cancer of lung ([place]) 04/21/2013  . 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 Chronic hepatitis C virus infection ([place]) 03/14/2013  . Disease of liver 03/14/2013  . Hepatic cirrhosis ([place]) 03/14/2013  . Alcoholism ([place]) 11/19/2012  . Chronic obstructive pulmonary disease ([place]) 11/19/2012  . Clinical depression 11/19/2012  . ED (erectile dysfunction) of organic origin 11/19/2012  . H/O  malignant neoplasm of prostate 11/19/2012  . BP (high blood pressure) 11/19/2012  . Adult hypothyroidism 11/19/2012  . Abdominal pain 11/21/2010  . Malignant neoplasm of prostate ([place]) 01/02/2010    Past Surgical History  Procedure Laterality Date  . Wrist fracture surgery    . Head & neck wound repair / closure      100 strutures  . Hemorrhoid surgery      1982  . Tonsillectomy      1957  . Lung removal, partial      2014  . Ct radiation therapy guide      left lung and liver 2015    Current Outpatient Rx  Name  Route  Sig  Dispense  Refill  . albuterol (PROVENTIL HFA;VENTOLIN HFA) 108 (90 BASE) MCG/ACT inhaler   Inhalation   Inhale 2 puffs into the lungs every 6 (six) hours as needed for wheezing or shortness of breath.         Marland Kitchen albuterol (PROVENTIL) (2.5 MG/3ML) 0.083% nebulizer solution   Nebulization   Take 2.5 mg by nebulization every 6 (six) hours as needed for wheezing or shortness of breath.         . Cholecalciferol (VITAMIN D-3) 1000 UNITS CAPS   Oral   Take 3 capsules by mouth daily.         . diphenhydrAMINE (SOMINEX) 25 MG tablet   Oral   Take 50 mg by mouth at bedtime as needed for sleep.         Marland Kitchen  ferrous sulfate 325 (65 FE) MG tablet   Oral   Take 325 mg by mouth daily with breakfast.         . FLUoxetine (PROZAC) 20 MG capsule   Oral   Take 20 mg by mouth daily.         Marland Kitchen gabapentin (NEURONTIN) 800 MG tablet   Oral   Take 800 mg by mouth 3 (three) times daily.         . Ledipasvir-Sofosbuvir (HARVONI) 90-400 MG TABS   Oral   Take 1 tablet by mouth daily.         Marland Kitchen levothyroxine (SYNTHROID, LEVOTHROID) 25 MCG tablet   Oral   Take 25 mcg by mouth daily before breakfast.         . lisinopril (PRINIVIL,ZESTRIL) 20 MG tablet   Oral   Take 20 mg by mouth daily.         . Magnesium 400 MG CAPS   Oral   Take 400 mg by mouth daily as needed.         . Melatonin 10 MG CAPS   Oral   Take 1 tablet by mouth at  bedtime.         Marland Kitchen omeprazole (PRILOSEC) 40 MG capsule   Oral   Take 40 mg by mouth daily.         Marland Kitchen oxyCODONE (ROXICODONE) 15 MG immediate release tablet   Oral   Take 1 tablet (15 mg total) by mouth every 6 (six) hours as needed for pain.   120 tablet   0     Do not place this medication, or any other prescri ...   . oxyCODONE (ROXICODONE) 15 MG immediate release tablet   Oral   Take 1 tablet (15 mg total) by mouth every 6 (six) hours as needed for pain.   120 tablet   0     Do not place this medication, or any other prescri ...   . oxyCODONE (ROXICODONE) 15 MG immediate release tablet   Oral   Take 1 tablet (15 mg total) by mouth every 6 (six) hours as needed for pain.   120 tablet   0     Do not place this medication, or any other prescri ...     Allergies Ciprofloxacin  Family History  Problem Relation Age of Onset  . Cancer Mother   . Diabetes Father   . Lung disease Father   . Parkinson's disease Father   . Hypertension Father     Social History Social History  Substance Use Topics  . Smoking status: Former Smoker -- 2.50 packs/day for 50 years    Types: Cigarettes    Quit date: 08/16/2006  . Smokeless tobacco: None  . Alcohol Use: No     Comment: former drinker 03/15/13    Review of Systems Constitutional: No fever/chills Eyes: No visual changes. ENT: No sore throat. Cardiovascular: Denies chest pain. Respiratory: Denies shortness of breath. Musculoskeletal: per HPI Skin: Negative for rash. Neurological: Negative for headaches, focal weakness or numbness.  10-point ROS otherwise negative.  ____________________________________________   PHYSICAL EXAM:  VITAL SIGNS: ED Triage Vitals  Enc Vitals Group     BP 04/18/15 1523 146/79 mmHg     Pulse Rate 04/18/15 1523 66     Resp 04/18/15 1523 14     Temp 04/18/15 1523 97.9 F (36.6 C)     Temp Source 04/18/15 1523 Oral     SpO2 04/18/15 1523 96 %  Weight 04/18/15 1523 190 lb  (86.183 kg)     Height 04/18/15 1523 '5\' 7"'$  (1.702 m)     Head Cir --      Peak Flow --      Pain Score 04/18/15 1525 6     Pain Loc --      Pain Edu? --      Excl. in [place]? --     Constitutional: Alert and oriented. Well appearing and in no acute distress. Eyes: Conjunctivae are normal. EOMI. Head: Atraumatic. Nose: No congestion/rhinnorhea. Mouth/Throat: Mucous membranes are moist.  Oropharynx non-erythematous. Neck: No stridor.  SUPPLE Cardiovascular: Normal rate, regular rhythm. Grossly normal heart sounds.  Good peripheral circulation. Respiratory: Normal respiratory effort.  No retractions. Lungs CTAB. Gastrointestinal: Soft and nontender. No distention. No abdominal bruits. No CVA tenderness.  Musculoskeletal: Knee, LEFT.  Stable joint with no laxity.  Tender along posterior medial joint line.  Minimal swelling medially, but no effusion.   Neg lachmans and mcmurray. Neurologic:  Normal speech and language. No gross focal neurologic deficits are appreciated. No gait instability. Skin:  Skin is warm, dry and intact. No rash noted. Psychiatric: Mood and affect are normal. Speech and behavior are normal.  ____________________________________________   LABS (all labs ordered are listed, but only abnormal results are displayed)  Labs Reviewed - No data to display ____________________________________________  EKG   ____________________________________________  RADIOLOGY   ____________________________________________   PROCEDURES  Procedure(s) performed:   Consent obtained.   Left knee cleansed with etoh.  Joint injected with lidocaine 1%, and 1cc kenalog '40mg'$ , lateral approach. Pt tolerated well.  No complications.  Critical Care performed: No  ____________________________________________   INITIAL IMPRESSION / ASSESSMENT AND PLAN / ED COURSE  Pertinent labs & imaging results that were available during my care of the patient were reviewed by me and considered in my  medical decision making (see chart for details).  69 year old with history of prior knee pain, who presents with acute left knee pain, mild swelling and tenderness along the posterior medial joint line.  Concern for mild meniscal injury, and arthritis flare.  He already takes pain med, for chronic pain, and he cannot take oral nsaids.  Proceeded to joint injection per procedure above.   He can follow up with his orthopedist, Dr. Jefm Bryant if not improving. ____________________________________________   FINAL CLINICAL IMPRESSION(S) / ED DIAGNOSES  Final diagnoses:  Knee pain, acute, left      Mortimer Fries, PA-C 04/18/15 1631  Lisa Roca, MD 04/19/15 0020

## 2015-04-18 NOTE — ED Notes (Signed)
Pt states he was running around going to the store and "all the sudden I think I twisted my foot and now my knee hurts and have swelling to my left knee" pt states he tried calling his other doctors but wasn't able to be seen

## 2015-04-18 NOTE — Discharge Instructions (Signed)
Knee Pain Knee pain is a very common symptom and can have many causes. Knee pain often goes away when you follow your health care provider's instructions for relieving pain and discomfort at home. However, knee pain can develop into a condition that needs treatment. Some conditions may include:  Arthritis caused by wear and tear (osteoarthritis).  Arthritis caused by swelling and irritation (rheumatoid arthritis or gout).  A cyst or growth in your knee.  An infection in your knee joint.  An injury that will not heal.  Damage, swelling, or irritation of the tissues that support your knee (torn ligaments or tendinitis). If your knee pain continues, additional tests may be ordered to diagnose your condition. Tests may include X-rays or other imaging studies of your knee. You may also need to have fluid removed from your knee. Treatment for ongoing knee pain depends on the cause, but treatment may include:  Medicines to relieve pain or swelling.  Steroid injections in your knee.  Physical therapy.  Surgery. HOME CARE INSTRUCTIONS  Take medicines only as directed by your health care provider.  Rest your knee and keep it raised (elevated) while you are resting.  Do not do things that cause or worsen pain.  Avoid high-impact activities or exercises, such as running, jumping rope, or doing jumping jacks.  Apply ice to the knee area:  Put ice in a plastic bag.  Place a towel between your skin and the bag.  Leave the ice on for 20 minutes, 2-3 times a day.  Ask your health care provider if you should wear an elastic knee support.  Keep a pillow under your knee when you sleep.  Lose weight if you are overweight. Extra weight can put pressure on your knee.  Do not use any tobacco products, including cigarettes, chewing tobacco, or electronic cigarettes. If you need help quitting, ask your health care provider. Smoking may slow the healing of any bone and joint problems that you may  have. SEEK MEDICAL CARE IF:  Your knee pain continues, changes, or gets worse.  You have a fever along with knee pain.  Your knee buckles or locks up.  Your knee becomes more swollen. SEEK IMMEDIATE MEDICAL CARE IF:   Your knee joint feels hot to the touch.  You have chest pain or trouble breathing.   This information is not intended to replace advice given to you by your health care provider. Make sure you discuss any questions you have with your health care provider.   Document Released: 03/29/2007 Document Revised: 06/22/2014 Document Reviewed: 01/15/2014 Elsevier Interactive Patient Education 2016 [place] the orthopedist Dr. Jefm Bryant, if not improving.  return to the emergency room for any concerns.

## 2015-05-28 ENCOUNTER — Other Ambulatory Visit: Payer: Self-pay

## 2015-06-03 HISTORY — PX: TUMOR REMOVAL: SHX12

## 2015-06-19 ENCOUNTER — Encounter: Payer: Self-pay | Admitting: Pain Medicine

## 2015-06-19 ENCOUNTER — Ambulatory Visit: Payer: Medicare Other | Attending: Pain Medicine | Admitting: Pain Medicine

## 2015-06-19 ENCOUNTER — Other Ambulatory Visit: Payer: Self-pay | Admitting: Pain Medicine

## 2015-06-19 VITALS — BP 133/92 | HR 71 | Temp 97.9°F | Resp 16 | Ht 66.0 in | Wt 174.0 lb

## 2015-06-19 DIAGNOSIS — D649 Anemia, unspecified: Secondary | ICD-10-CM | POA: Diagnosis not present

## 2015-06-19 DIAGNOSIS — I10 Essential (primary) hypertension: Secondary | ICD-10-CM | POA: Insufficient documentation

## 2015-06-19 DIAGNOSIS — Z87891 Personal history of nicotine dependence: Secondary | ICD-10-CM | POA: Diagnosis not present

## 2015-06-19 DIAGNOSIS — M25561 Pain in right knee: Secondary | ICD-10-CM | POA: Insufficient documentation

## 2015-06-19 DIAGNOSIS — N529 Male erectile dysfunction, unspecified: Secondary | ICD-10-CM | POA: Insufficient documentation

## 2015-06-19 DIAGNOSIS — G8929 Other chronic pain: Secondary | ICD-10-CM | POA: Diagnosis not present

## 2015-06-19 DIAGNOSIS — E039 Hypothyroidism, unspecified: Secondary | ICD-10-CM | POA: Insufficient documentation

## 2015-06-19 DIAGNOSIS — M542 Cervicalgia: Secondary | ICD-10-CM | POA: Insufficient documentation

## 2015-06-19 DIAGNOSIS — Z7189 Other specified counseling: Secondary | ICD-10-CM

## 2015-06-19 DIAGNOSIS — Z9889 Other specified postprocedural states: Secondary | ICD-10-CM | POA: Insufficient documentation

## 2015-06-19 DIAGNOSIS — Z8546 Personal history of malignant neoplasm of prostate: Secondary | ICD-10-CM | POA: Insufficient documentation

## 2015-06-19 DIAGNOSIS — K5903 Drug induced constipation: Secondary | ICD-10-CM

## 2015-06-19 DIAGNOSIS — Z5181 Encounter for therapeutic drug level monitoring: Secondary | ICD-10-CM

## 2015-06-19 DIAGNOSIS — M25562 Pain in left knee: Secondary | ICD-10-CM | POA: Diagnosis not present

## 2015-06-19 DIAGNOSIS — M25519 Pain in unspecified shoulder: Secondary | ICD-10-CM | POA: Insufficient documentation

## 2015-06-19 DIAGNOSIS — K5730 Diverticulosis of large intestine without perforation or abscess without bleeding: Secondary | ICD-10-CM | POA: Diagnosis not present

## 2015-06-19 DIAGNOSIS — F418 Other specified anxiety disorders: Secondary | ICD-10-CM | POA: Diagnosis not present

## 2015-06-19 DIAGNOSIS — J449 Chronic obstructive pulmonary disease, unspecified: Secondary | ICD-10-CM | POA: Insufficient documentation

## 2015-06-19 DIAGNOSIS — T402X5A Adverse effect of other opioids, initial encounter: Secondary | ICD-10-CM | POA: Insufficient documentation

## 2015-06-19 DIAGNOSIS — F1190 Opioid use, unspecified, uncomplicated: Secondary | ICD-10-CM

## 2015-06-19 DIAGNOSIS — G4700 Insomnia, unspecified: Secondary | ICD-10-CM | POA: Diagnosis not present

## 2015-06-19 DIAGNOSIS — M792 Neuralgia and neuritis, unspecified: Secondary | ICD-10-CM | POA: Insufficient documentation

## 2015-06-19 DIAGNOSIS — B182 Chronic viral hepatitis C: Secondary | ICD-10-CM | POA: Insufficient documentation

## 2015-06-19 DIAGNOSIS — K7460 Unspecified cirrhosis of liver: Secondary | ICD-10-CM | POA: Diagnosis not present

## 2015-06-19 DIAGNOSIS — C3490 Malignant neoplasm of unspecified part of unspecified bronchus or lung: Secondary | ICD-10-CM | POA: Diagnosis not present

## 2015-06-19 DIAGNOSIS — Z79891 Long term (current) use of opiate analgesic: Secondary | ICD-10-CM | POA: Diagnosis not present

## 2015-06-19 DIAGNOSIS — F1290 Cannabis use, unspecified, uncomplicated: Secondary | ICD-10-CM | POA: Insufficient documentation

## 2015-06-19 DIAGNOSIS — G4701 Insomnia due to medical condition: Secondary | ICD-10-CM | POA: Insufficient documentation

## 2015-06-19 DIAGNOSIS — M47812 Spondylosis without myelopathy or radiculopathy, cervical region: Secondary | ICD-10-CM | POA: Diagnosis not present

## 2015-06-19 MED ORDER — BENEFIBER PO POWD: ORAL | Status: DC

## 2015-06-19 MED ORDER — DOCUSATE SODIUM 100 MG PO CAPS: 200.0000 mg | ORAL_CAPSULE | Freq: Every evening | ORAL | Status: DC | PRN

## 2015-06-19 MED ORDER — MAGNESIUM 400 MG PO CAPS: 400.0000 mg | ORAL_CAPSULE | Freq: Every day | ORAL | Status: DC | PRN

## 2015-06-19 MED ORDER — OXYCODONE HCL 15 MG PO TABS: 15.0000 mg | ORAL_TABLET | Freq: Four times a day (QID) | ORAL | Status: DC | PRN

## 2015-06-19 MED ORDER — BISACODYL 5 MG PO TBEC: 10.0000 mg | DELAYED_RELEASE_TABLET | Freq: Every evening | ORAL | Status: DC | PRN

## 2015-06-19 MED ORDER — MELATONIN 10 MG PO CAPS
1.0000 | ORAL_CAPSULE | Freq: Every day | ORAL | Status: DC
Start: 2015-06-19 — End: 2015-08-21

## 2015-06-19 MED ORDER — GABAPENTIN 800 MG PO TABS: 800.0000 mg | ORAL_TABLET | Freq: Three times a day (TID) | ORAL | Status: DC

## 2015-06-19 NOTE — Progress Notes (Signed)
Patient reports for medication refill.  He s/p tumor removal from liver at [facility] on 06/03/2015.   Oxycodone 15 mg IR qty 9.  Last refill 05/20/2015

## 2015-06-19 NOTE — Progress Notes (Signed)
Patient's Name: [facility]. MRN: [id number] DOB: 1946/06/06 DOS: 06/19/2015  Primary Reason(s) for Visit: Encounter for Medication Management CC: Neck Pain and Shoulder Pain   HPI:    Tyrone Dominguez is a 70 y.o. year old, male patient, who returns today as an established patient. He has Abdominal pain; Alcoholism ([place]); Malignant neoplastic disease ([place]); Chronic hepatitis C virus infection ([place]); Disease of liver; Hepatic cirrhosis ([place]); Chronic obstructive pulmonary disease ([place]); Clinical depression; Colon, diverticulosis; ED (erectile dysfunction) of organic origin; H/O malignant neoplasm of prostate; Hepatocellular carcinoma ([place]); BP (high blood pressure); Adult hypothyroidism; Pain in shoulder; Cancer of lung ([place]); Primary malignant neoplasm of liver ([place]); Malignant neoplasm of prostate ([place]); Arthralgia of shoulder; 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[place]); Chronic pain syndrome; Arthropathy of left shoulder; Uncomplicated opioid dependence ([place]); Medical marijuana use; Bilateral chronic knee pain; Long term prescription opiate use; Cancer related pain; Cervicalgia; Cervical spondylosis without myelopathy; Chronic low back pain; Long term current use of opiate analgesic; Opiate use; Encounter for therapeutic drug level monitoring; Chronic neck pain; Encounter for chronic pain management; Chronic pain; Neuropathic pain; Neurogenic pain; Therapeutic opioid-induced constipation (OIC); Hypomagnesemia; and Insomnia secondary to chronic pain on his problem list.. His primarily concern today is the Neck Pain and Shoulder Pain     The patient returns to the clinics today for pharmacological management of his chronic pain. Unfortunately, since the last time that we saw him he was found to have a liver tumor. He had surgery to cut of the blood supply to the tumor and he is beginning to feel somewhat better from that. However, he indicates having had a bumpy postoperative period. He indicates that  some of the medicines that he was given after the surgery may still show up on the UDS. We have made and noted that.  Today's Pain Score: 5  (last pain medication today @ 0800), clinically he looks like a 3/10. Reported level of pain is incompatible with clinical obrservations. This may be secondary to a possible lack of understanding on how the pain scale works. Pain Type: Chronic pain Pain Location: Neck (neck and L hand are greatest cause of pain currently ) Pain Orientation: Right (right neck since having procedure at [facility] for removal  of liver nodule) Pain Descriptors / Indicators: Stabbing Pain Frequency: Intermittent       Pharmacotherapy Review:   Side-effects or Adverse reactions: None reported Effectiveness: Described as relatively effective, allowing for increase in activities of daily living (ADL) Onset of action: Within expected pharmacological parameters Duration of action: Within normal limits for medication Peak effect: Timing and results are as within normal expected parameters [place] PMP: Compliant with practice rules and regulations UDS Results: His last UDS was done on 03/18/2015 and it was within normal limits. He remains compliant. UDS Interpretation: Patient appears to be compliant with practice rules and regulations Medication Assessment Form: Reviewed. Patient indicates being compliant with therapy Treatment compliance: Compliant Substance Use Disorder (SUD) Risk Level: Low Pharmacologic Plan: Continue therapy as is  Lab Work: Inflammation Markers Lab Results  Component Value Date   ESRSEDRATE 18 01/02/2014    Renal Function Lab Results  Component Value Date   BUN 5* 03/29/2014   CREATININE 0.77 03/29/2014   GFRAA >60 03/29/2014   GFRNONAA >60 03/29/2014    Hepatic Function Lab Results  Component Value Date   AST 41* 01/02/2014   ALT 37 01/02/2014   ALBUMIN 3.6 01/02/2014    Electrolytes Lab Results  Component Value Date  NA 137 03/29/2014    K 4.4 03/29/2014   CL 102 03/29/2014   CALCIUM 8.8 03/29/2014    Allergies:  Tyrone Dominguez is allergic to morphine and related and ciprofloxacin.  Meds:  The patient has a current medication list which includes the following prescription(s): albuterol, vitamin d-3, diphenhydramine, ferrous sulfate, fluoxetine, furosemide, gabapentin, levothyroxine, lisinopril, magnesium, melatonin, menaquinone-7, mometasone, omeprazole, oxycodone, tolterodine, bisacodyl, docusate sodium, oxycodone, oxycodone, and benefiber. Requested Prescriptions   Signed Prescriptions Disp Refills  . gabapentin (NEURONTIN) 800 MG tablet 90 tablet 2    Sig: Take 1 tablet (800 mg total) by mouth 3 (three) times daily.  . Magnesium 400 MG CAPS 30 capsule 2    Sig: Take 400 mg by mouth daily as needed.  . Melatonin 10 MG CAPS 30 capsule 2    Sig: Take 1 tablet by mouth at bedtime.  Marland Kitchen oxyCODONE (ROXICODONE) 15 MG immediate release tablet 120 tablet 0    Sig: Take 1 tablet (15 mg total) by mouth every 6 (six) hours as needed for pain.  Marland Kitchen oxyCODONE (ROXICODONE) 15 MG immediate release tablet 120 tablet 0    Sig: Take 1 tablet (15 mg total) by mouth every 6 (six) hours as needed for pain.  Marland Kitchen oxyCODONE (ROXICODONE) 15 MG immediate release tablet 120 tablet 0    Sig: Take 1 tablet (15 mg total) by mouth every 6 (six) hours as needed for pain.  . Wheat Dextrin (BENEFIBER) POWD 500 g PRN    Sig: Stir 2 tsp. TID into 4-8 oz of any non-carbonated beverage or soft food (hot or cold)  . docusate sodium (COLACE) 100 MG capsule 60 capsule PRN    Sig: Take 2 capsules (200 mg total) by mouth at bedtime as needed for moderate constipation. Do not use longer than 7 days.  . bisacodyl (DULCOLAX) 5 MG EC tablet 100 tablet PRN    Sig: Take 2 tablets (10 mg total) by mouth at bedtime as needed for moderate constipation ((Hold for loose stool)).    ROS:  Constitutional: Afebrile, no chills, well hydrated and well  nourished Gastrointestinal: negative Musculoskeletal:negative Neurological: negative Behavioral/Psych: negative  PFSH:  Medical:  Tyrone Dominguez  has a past medical history of Anxiety; Cancer ([place]); Arthritis; Depression; and Anemia. Family: family history includes Cancer in his mother; Diabetes in his father; Hypertension in his father; Lung disease in his father; Parkinson's disease in his father. Surgical:  has past surgical history that includes Wrist fracture surgery; Head & neck wound repair / closure; Hemorrhoid surgery; Tonsillectomy; Lung removal, partial; CT RADIATION THERAPY GUIDE; and Tumor removal (06/03/2015). Tobacco:  reports that he quit smoking about 8 years ago. His smoking use included Cigarettes. He has a 125 pack-year smoking history. He does not have any smokeless tobacco history on file. Alcohol:  reports that he does not drink alcohol. Drug:  reports that he does not use illicit drugs.  Physical Exam:  Vitals:  Today's Vitals   06/19/15 1140  BP: 133/92  Pulse: 71  Temp: 97.9 F (36.6 C)  TempSrc: Oral  Resp: 16  Height: '5\' 6"'$  (1.676 m)  Weight: 174 lb (78.926 kg)  SpO2: 100%  PainSc: 5   Calculated BMI: Body mass index is 28.1 kg/(m^2). General appearance: alert, cooperative, appears older than stated age, cachectic, fatigued, mild distress and pale Eyes: PERLA Respiratory: No evidence respiratory distress, no audible rales or ronchi and no use of accessory muscles of respiration Neck: no adenopathy, no carotid bruit, no JVD,  supple, symmetrical, trachea midline and thyroid not enlarged, symmetric, no tenderness/mass/nodules  Assessment:  Encounter Diagnosis:  Primary Diagnosis: Chronic pain [G89.29]  Plan:   Interventional Therapies: None at this point.    Tyrone Dominguez was seen today for neck pain and shoulder pain.  Diagnoses and all orders for this visit:  Chronic pain -     oxyCODONE (ROXICODONE) 15 MG immediate release tablet; Take 1 tablet (15 mg  total) by mouth every 6 (six) hours as needed for pain. -     oxyCODONE (ROXICODONE) 15 MG immediate release tablet; Take 1 tablet (15 mg total) by mouth every 6 (six) hours as needed for pain. -     oxyCODONE (ROXICODONE) 15 MG immediate release tablet; Take 1 tablet (15 mg total) by mouth every 6 (six) hours as needed for pain.  Long term current use of opiate analgesic -     Drugs of abuse screen w/o alc, rtn urine-sln  Opiate use  Encounter for therapeutic drug level monitoring  Chronic neck pain  Encounter for chronic pain management  Neuropathic pain -     gabapentin (NEURONTIN) 800 MG tablet; Take 1 tablet (800 mg total) by mouth 3 (three) times daily.  Neurogenic pain -     gabapentin (NEURONTIN) 800 MG tablet; Take 1 tablet (800 mg total) by mouth 3 (three) times daily.  Therapeutic opioid-induced constipation (OIC) -     Wheat Dextrin (BENEFIBER) POWD; Stir 2 tsp. TID into 4-8 oz of any non-carbonated beverage or soft food (hot or cold) -     docusate sodium (COLACE) 100 MG capsule; Take 2 capsules (200 mg total) by mouth at bedtime as needed for moderate constipation. Do not use longer than 7 days. -     bisacodyl (DULCOLAX) 5 MG EC tablet; Take 2 tablets (10 mg total) by mouth at bedtime as needed for moderate constipation ((Hold for loose stool)).  Hypomagnesemia -     Magnesium 400 MG CAPS; Take 400 mg by mouth daily as needed.  Insomnia secondary to chronic pain -     Melatonin 10 MG CAPS; Take 1 tablet by mouth at bedtime.     There are no Patient Instructions on file for this visit. Medications discontinued today:  Medications Discontinued During This Encounter  Medication Reason  . albuterol (PROVENTIL) (2.5 MG/3ML) 0.083% nebulizer solution Error  . Ledipasvir-Sofosbuvir (HARVONI) 90-400 MG TABS Error  . oxyCODONE (ROXICODONE) 15 MG immediate release tablet Error  . oxyCODONE (ROXICODONE) 15 MG immediate release tablet Error  . gabapentin (NEURONTIN) 800  MG tablet Reorder  . Magnesium 400 MG CAPS Reorder  . Melatonin 10 MG CAPS Reorder  . oxyCODONE (ROXICODONE) 15 MG immediate release tablet Reorder  . oxyCODONE (ROXICODONE) 15 MG immediate release tablet Error  . oxyCODONE (ROXICODONE) 15 MG immediate release tablet Error   Medications administered today:  Mr. Steidle had no medications administered during this visit.  Primary Care Physician: Elyse Jarvis, MD Location: [facility] Note by: Kathlen Brunswick. Dossie Arbour, M.D, DABA, DABAPM, DABPM, DABIPP, FIPP

## 2015-06-24 LAB — TOXASSURE SELECT 13 (MW), URINE: PDF: 0

## 2015-08-19 ENCOUNTER — Telehealth: Payer: Self-pay | Admitting: Pain Medicine

## 2015-08-19 NOTE — Telephone Encounter (Signed)
Had a fall and has had extreme severe pain, having trouble bending over and walking, what should he do ?

## 2015-08-19 NOTE — Telephone Encounter (Signed)
Attempted to call patient, left message.  

## 2015-08-20 NOTE — Telephone Encounter (Signed)
Attempted to call patient on both numbers and no answer.  Left message on both numbers.

## 2015-08-20 NOTE — Telephone Encounter (Signed)
Pt wants Kori to call him back concerning his MRI that he wants to have. Please call pt on his cell [phone] if he is not at home.

## 2015-08-21 ENCOUNTER — Ambulatory Visit (HOSPITAL_BASED_OUTPATIENT_CLINIC_OR_DEPARTMENT_OTHER): Payer: Medicare Other | Admitting: Pain Medicine

## 2015-08-21 ENCOUNTER — Other Ambulatory Visit
Admission: RE | Admit: 2015-08-21 | Discharge: 2015-08-21 | Disposition: A | Discharge status: Home / Self Care | Payer: Medicare Other | Source: Ambulatory Visit | Attending: Pain Medicine | Admitting: Pain Medicine

## 2015-08-21 ENCOUNTER — Ambulatory Visit
Admission: RE | Admit: 2015-08-21 | Discharge: 2015-08-21 | Disposition: A | Discharge status: Home / Self Care | Payer: Medicare Other | Source: Ambulatory Visit | Attending: Pain Medicine | Admitting: Pain Medicine

## 2015-08-21 ENCOUNTER — Encounter: Payer: Self-pay | Admitting: Pain Medicine

## 2015-08-21 VITALS — BP 147/90 | HR 58 | Temp 98.0°F | Resp 16 | Ht 66.0 in | Wt 180.0 lb

## 2015-08-21 DIAGNOSIS — M545 Low back pain, unspecified: Secondary | ICD-10-CM

## 2015-08-21 DIAGNOSIS — G8929 Other chronic pain: Secondary | ICD-10-CM | POA: Insufficient documentation

## 2015-08-21 DIAGNOSIS — R1031 Right lower quadrant pain: Secondary | ICD-10-CM | POA: Diagnosis not present

## 2015-08-21 DIAGNOSIS — I10 Essential (primary) hypertension: Secondary | ICD-10-CM | POA: Insufficient documentation

## 2015-08-21 DIAGNOSIS — G4701 Insomnia due to medical condition: Secondary | ICD-10-CM

## 2015-08-21 DIAGNOSIS — C3490 Malignant neoplasm of unspecified part of unspecified bronchus or lung: Secondary | ICD-10-CM | POA: Insufficient documentation

## 2015-08-21 DIAGNOSIS — N529 Male erectile dysfunction, unspecified: Secondary | ICD-10-CM | POA: Insufficient documentation

## 2015-08-21 DIAGNOSIS — M79606 Pain in leg, unspecified: Secondary | ICD-10-CM | POA: Insufficient documentation

## 2015-08-21 DIAGNOSIS — R109 Unspecified abdominal pain: Secondary | ICD-10-CM | POA: Diagnosis not present

## 2015-08-21 DIAGNOSIS — C228 Malignant neoplasm of liver, primary, unspecified as to type: Secondary | ICD-10-CM | POA: Insufficient documentation

## 2015-08-21 DIAGNOSIS — F329 Major depressive disorder, single episode, unspecified: Secondary | ICD-10-CM | POA: Insufficient documentation

## 2015-08-21 DIAGNOSIS — D649 Anemia, unspecified: Secondary | ICD-10-CM | POA: Diagnosis not present

## 2015-08-21 DIAGNOSIS — Z8546 Personal history of malignant neoplasm of prostate: Secondary | ICD-10-CM | POA: Diagnosis not present

## 2015-08-21 DIAGNOSIS — K5903 Drug induced constipation: Secondary | ICD-10-CM | POA: Insufficient documentation

## 2015-08-21 DIAGNOSIS — G4700 Insomnia, unspecified: Secondary | ICD-10-CM | POA: Insufficient documentation

## 2015-08-21 DIAGNOSIS — Z87891 Personal history of nicotine dependence: Secondary | ICD-10-CM | POA: Diagnosis not present

## 2015-08-21 DIAGNOSIS — M818 Other osteoporosis without current pathological fracture: Secondary | ICD-10-CM

## 2015-08-21 DIAGNOSIS — F1290 Cannabis use, unspecified, uncomplicated: Secondary | ICD-10-CM | POA: Insufficient documentation

## 2015-08-21 DIAGNOSIS — F1020 Alcohol dependence, uncomplicated: Secondary | ICD-10-CM | POA: Insufficient documentation

## 2015-08-21 DIAGNOSIS — B182 Chronic viral hepatitis C: Secondary | ICD-10-CM | POA: Diagnosis not present

## 2015-08-21 DIAGNOSIS — K7460 Unspecified cirrhosis of liver: Secondary | ICD-10-CM | POA: Diagnosis not present

## 2015-08-21 DIAGNOSIS — K5790 Diverticulosis of intestine, part unspecified, without perforation or abscess without bleeding: Secondary | ICD-10-CM | POA: Insufficient documentation

## 2015-08-21 DIAGNOSIS — Z5181 Encounter for therapeutic drug level monitoring: Secondary | ICD-10-CM

## 2015-08-21 DIAGNOSIS — Z79891 Long term (current) use of opiate analgesic: Secondary | ICD-10-CM | POA: Insufficient documentation

## 2015-08-21 DIAGNOSIS — M25562 Pain in left knee: Secondary | ICD-10-CM | POA: Insufficient documentation

## 2015-08-21 DIAGNOSIS — T402X5A Adverse effect of other opioids, initial encounter: Secondary | ICD-10-CM

## 2015-08-21 DIAGNOSIS — M25561 Pain in right knee: Secondary | ICD-10-CM | POA: Insufficient documentation

## 2015-08-21 DIAGNOSIS — M47816 Spondylosis without myelopathy or radiculopathy, lumbar region: Secondary | ICD-10-CM | POA: Insufficient documentation

## 2015-08-21 DIAGNOSIS — J449 Chronic obstructive pulmonary disease, unspecified: Secondary | ICD-10-CM | POA: Diagnosis not present

## 2015-08-21 DIAGNOSIS — E039 Hypothyroidism, unspecified: Secondary | ICD-10-CM | POA: Diagnosis not present

## 2015-08-21 DIAGNOSIS — M25519 Pain in unspecified shoulder: Secondary | ICD-10-CM | POA: Diagnosis not present

## 2015-08-21 DIAGNOSIS — C61 Malignant neoplasm of prostate: Secondary | ICD-10-CM

## 2015-08-21 DIAGNOSIS — M792 Neuralgia and neuritis, unspecified: Secondary | ICD-10-CM

## 2015-08-21 LAB — COMPREHENSIVE METABOLIC PANEL
ALT: 19 U/L (ref 17–63)
AST: 32 U/L (ref 15–41)
Albumin: 3.6 g/dL (ref 3.5–5.0)
Alkaline Phosphatase: 182 U/L — ABNORMAL HIGH (ref 38–126)
Anion gap: 9 (ref 5–15)
BUN: 12 mg/dL (ref 6–20)
CO2: 26 mmol/L (ref 22–32)
Calcium: 9.1 mg/dL (ref 8.9–10.3)
Chloride: 101 mmol/L (ref 101–111)
Creatinine, Ser: 0.89 mg/dL (ref 0.61–1.24)
GFR calc Af Amer: >60 mL/min (ref >60)
GFR calc non Af Amer: >60 mL/min (ref >60)
Glucose, Bld: 103 mg/dL — ABNORMAL HIGH (ref 65–99)
Potassium: 4.1 mmol/L (ref 3.5–5.1)
Sodium: 136 mmol/L (ref 135–145)
Total Bilirubin: 0.5 mg/dL (ref 0.3–1.2)
Total Protein: 7.5 g/dL (ref 6.5–8.1)

## 2015-08-21 LAB — VITAMIN B12: VITAMIN B 12: 408 pg/mL (ref 180–914)

## 2015-08-21 LAB — C-REACTIVE PROTEIN: CRP: 0.7 mg/dL (ref <1.0)

## 2015-08-21 LAB — SEDIMENTATION RATE: Sed Rate: 45 mm/hr — ABNORMAL HIGH (ref 0–20)

## 2015-08-21 LAB — MAGNESIUM: Magnesium: 1.9 mg/dL (ref 1.7–2.4)

## 2015-08-21 MED ORDER — TRIAMCINOLONE ACETONIDE 40 MG/ML IJ SUSP
INTRAMUSCULAR | Status: AC
  Filled 2015-08-21: qty 1

## 2015-08-21 MED ORDER — GABAPENTIN 800 MG PO TABS: 800.0000 mg | ORAL_TABLET | Freq: Three times a day (TID) | ORAL | Status: DC

## 2015-08-21 MED ORDER — MELATONIN 10 MG PO CAPS: 1.0000 | ORAL_CAPSULE | Freq: Every day | ORAL | Status: DC

## 2015-08-21 MED ORDER — MAGNESIUM 400 MG PO CAPS: 400.0000 mg | ORAL_CAPSULE | Freq: Every day | ORAL | Status: DC | PRN

## 2015-08-21 MED ORDER — ROPIVACAINE HCL 2 MG/ML IJ SOLN
INTRAMUSCULAR | Status: AC
  Filled 2015-08-21: qty 10

## 2015-08-21 MED ORDER — BENEFIBER PO POWD: ORAL | Status: DC

## 2015-08-21 MED ORDER — OXYCODONE HCL 15 MG PO TABS: 15.0000 mg | ORAL_TABLET | Freq: Four times a day (QID) | ORAL | Status: DC | PRN

## 2015-08-21 MED ORDER — DOCUSATE SODIUM 100 MG PO CAPS: 200.0000 mg | ORAL_CAPSULE | Freq: Every evening | ORAL | Status: DC | PRN

## 2015-08-21 MED ORDER — METHYLPREDNISOLONE 4 MG PO TBPK: ORAL_TABLET | ORAL | Status: DC

## 2015-08-21 MED ORDER — BISACODYL 5 MG PO TBEC: 10.0000 mg | DELAYED_RELEASE_TABLET | Freq: Every evening | ORAL | Status: DC | PRN

## 2015-08-21 NOTE — Progress Notes (Signed)
Safety precautions to be maintained throughout the outpatient stay will include: orient to surroundings, keep bed in low position, maintain call bell within reach at all times, provide assistance with transfer out of bed and ambulation.  

## 2015-08-21 NOTE — Progress Notes (Signed)
Patient's Name: Tyrone Dominguez. MRN: [id number] DOB: [birth year] DOS: 08/21/2015  Primary Reason(s) for Visit: Evaluation of one or more chronic illness with mild exacerbation or progression CC: Back Pain   HPI  [name] is a 70 y.o. year old, male patient, who returns today as an established patient. He has Abdominal pain; Alcoholism ([place]); Malignant neoplastic disease ([place]); Chronic hepatitis C virus infection ([place]); Disease of liver; Hepatic cirrhosis ([place]); Chronic obstructive pulmonary disease ([place]); Clinical depression; Colon, diverticulosis; ED (erectile dysfunction) of organic origin; H/O malignant neoplasm of prostate; Hepatocellular carcinoma ([place]); BP (high blood pressure); Adult hypothyroidism; Pain in shoulder; Cancer of lung ([place]); Primary malignant neoplasm of liver ([place]); Malignant neoplasm of prostate ([place]); Arthralgia of shoulder; 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[place]); Chronic pain syndrome; Arthropathy of left shoulder; Uncomplicated opioid dependence ([place]); Medical marijuana use; Bilateral chronic knee pain; Long term prescription opiate use; Cancer related pain; Cervicalgia; Cervical spondylosis without myelopathy; Chronic low back pain (Location of Primary Source of Pain) (Bilateral) (R>L); Long term current use of opiate analgesic; Opiate use; Encounter for therapeutic drug level monitoring; Chronic neck pain; Encounter for chronic pain management; Chronic pain; Neuropathic pain; Neurogenic pain; Therapeutic opioid-induced constipation (OIC); Hypomagnesemia; Insomnia secondary to chronic pain; Acute low back pain; Chronic lower extremity pain (Location of Secondary source of pain) (Bilateral) (R>L); Chronic groin pain (Right); and Osteoporosis, idiopathic on his problem list.. His primarily concern today is the Back Pain   The patient indicates having fallen and he is now having a flareup of his usual low back pain. He has a history significant for prostate, lung, and  liver cancer. He refers that the last time he had a bone scan was more than 2 years ago and nobody spinning following his prostate cancer. His primary pain today is in the lower back with most of it on the right side and going down to the right groin area which makes me think about the possibility of a lumbar fracture at the L1 level. He may also have some metastases to the spine and therefore I will work him up to find out if this is the case.  Pain Assessment: Self-Reported Pain Score: 8  Reported level is compatible with observation Pain Type: Chronic pain Pain Location: Back Pain Orientation: Lower Pain Descriptors / Indicators: Sharp Pain Frequency: Constant  Date of Last Visit: 06/19/15 Service Provided on Last Visit: Med Refill  Controlled Substance Pharmacotherapy Assessment  Analgesic: Oxycodone IR 15 mg every 6 hours (60 mg/day) MME/day: 90 mg/day Pharmacokinetics: Onset of action (Liberation/Absorption): Within expected pharmacological parameters Time to Peak effect (Distribution): Timing and results are as within normal expected parameters Duration of action (Metabolism/Excretion): Within normal limits for medication Pharmacodynamics: Analgesic Effect: More than 50% Activity Facilitation: Medication(s) allow patient to sit, stand, walk, and do the basic ADLs Perceived Effectiveness: Described as relatively effective, allowing for increase in activities of daily living (ADL) Side-effects or Adverse reactions: None reported Monitoring: [place] PMP: Compliant with practice rules and regulations UDS Results/interpretation: His last UDS was done on 06/19/2015 NA came back abnormal with unexpected levels of morphine which we know he received while he was hospitalized, prior to that visit. Because of this, I do not consider that this to be an abnormal test. Medication Assessment Form: Reviewed. Patient indicates being compliant with therapy Treatment compliance: Compliant Risk  Assessment: Aberrant Behavior: None observed today Substance Use Disorder (SUD) Risk Level: Low Opioid Risk Tool (ORT) Score: Total Score: 0 Low Risk for SUD (Score <  3) Depression Scale Score: PHQ-2: PHQ-2 Total Score: 0 No depression (0) PHQ-9: PHQ-9 Total Score: 0 No depression (0-4)  Pharmacologic Plan: No change in therapy, at this time   Laboratory Workup  Last ED UDS: No results found for: THCU, COCAINSCRNUR, PCPSCRNUR, MDMA, AMPHETMU, METHADONE, ETOH  Inflammation Markers Lab Results  Component Value Date   ESRSEDRATE 45* 08/21/2015    Renal Function Lab Results  Component Value Date   BUN 12 08/21/2015   CREATININE 0.89 08/21/2015   GFRAA >60 08/21/2015   GFRNONAA >60 08/21/2015    Hepatic Function Lab Results  Component Value Date   AST 32 08/21/2015   ALT 19 08/21/2015   ALBUMIN 3.6 08/21/2015    Electrolytes Lab Results  Component Value Date   NA 136 08/21/2015   K 4.1 08/21/2015   CL 101 08/21/2015   CALCIUM 9.1 08/21/2015   MG 1.9 08/21/2015    Allergies  [name] is allergic to morphine and related and ciprofloxacin.  Meds  The patient has a current medication list which includes the following prescription(s): albuterol, vitamin d-3, diphenhydramine, docusate sodium, ferrous sulfate, fluoxetine, furosemide, furosemide, gabapentin, hydrochlorothiazide, levothyroxine, lisinopril, magnesium, melatonin, menaquinone-7, mometasone, multivitamin, naproxen dr, nystatin, omeprazole, oxybutynin, oxycodone, oxycodone, oxycodone, ra krill oil, senna-docusate, tolterodine, trospium, turmeric curcumin, turmeric, benefiber, amoxicillin-clavulanate, azithromycin, bisacodyl, diphenhydramine, loratadine-pseudoephedrine, and methylprednisolone.  Current Outpatient Prescriptions on File Prior to Visit  Medication Sig  . albuterol (PROVENTIL HFA;VENTOLIN HFA) 108 (90 BASE) MCG/ACT inhaler Inhale 2 puffs into the lungs every 6 (six) hours as needed for wheezing or  shortness of breath.  . Cholecalciferol (VITAMIN D-3) 1000 UNITS CAPS Take 5 capsules by mouth daily.   . diphenhydrAMINE (SOMINEX) 25 MG tablet Take 50 mg by mouth at bedtime as needed for sleep.  . ferrous sulfate 325 (65 FE) MG tablet Take 325 mg by mouth daily with breakfast.  . FLUoxetine (PROZAC) 20 MG capsule Take 40 mg by mouth daily. 40 mg qhs  . furosemide (LASIX) 20 MG tablet Take 20 mg by mouth daily.  Marland Kitchen levothyroxine (SYNTHROID, LEVOTHROID) 25 MCG tablet Take 25 mcg by mouth daily before breakfast.  . lisinopril (PRINIVIL,ZESTRIL) 20 MG tablet Take 20 mg by mouth daily.  . Menaquinone-7 (VITAMIN K2 PO) Take 100 mEq by mouth every morning.  . mometasone (ASMANEX) 220 MCG/INH inhaler Inhale 2 puffs into the lungs daily.  Marland Kitchen omeprazole (PRILOSEC) 40 MG capsule Take 20 mg by mouth daily.   Marland Kitchen tolterodine (DETROL LA) 4 MG 24 hr capsule Take 4 mg by mouth daily.   No current facility-administered medications on file prior to visit.    ROS  Constitutional: Afebrile, no chills, well hydrated and well nourished Gastrointestinal: negative Musculoskeletal:negative Neurological: negative Behavioral/Psych: negative  PFSH  Medical:  [name]  has a past medical history of Anxiety; Cancer ([place]); Arthritis; Depression; and Anemia. Family: family history includes Cancer in his mother; Diabetes in his father; Hypertension in his father; Lung disease in his father; Parkinson's disease in his father. Surgical:  has past surgical history that includes Wrist fracture surgery; Head & neck wound repair / closure; Hemorrhoid surgery; Tonsillectomy; Lung removal, partial; CT RADIATION THERAPY GUIDE; and Tumor removal (06/03/2015). Tobacco:  reports that he quit smoking about 9 years ago. His smoking use included Cigarettes. He has a 125 pack-year smoking history. He does not have any smokeless tobacco history on file. Alcohol:  reports that he does not drink alcohol. Drug:  reports that he does not  use illicit  drugs.  Physical Exam  Vitals:  Today's Vitals   08/21/15 0837  BP: 147/90  Pulse: 58  Temp: 98 F (36.7 C)  Resp: 16  Height: '5\' 6"'$  (1.676 m)  Weight: 180 lb (81.647 kg)  SpO2: 100%  PainSc: 8   PainLoc: Back    Calculated BMI: Body mass index is 29.07 kg/(m^2). Overweight (25-29.9 kg/m2) - 20% higher incidence of chronic pain  General appearance: alert, cooperative, appears stated age and moderate distress Eyes: PERLA Respiratory: No evidence respiratory distress, no audible rales or ronchi and no use of accessory muscles of respiration  Cervical Spine Inspection: Normal anatomy Alignment: Symetrical ROM: Adequate  Upper Extremities Inspection: No gross anomalies detected ROM: Adequate Sensory: Normal Motor: Unremarkable  Thoracic Spine Inspection: No gross anomalies detected Alignment: Symetrical ROM: Adequate  Lumbar Spine Inspection: No gross anomalies detected Alignment: Symetrical ROM: Decreased with some tenderness to palpation over the lower lumbar area  Gait: Antalgic (limping)  Lower Extremities Inspection: No gross anomalies detected ROM: Adequate Sensory:  Normal Motor: Unremarkable  Assessment & Plan  Primary Diagnosis & Pertinent Problem List: The primary encounter diagnosis was Acute low back pain. Diagnoses of Chronic low back pain (Location of Primary Source of Pain) (Bilateral) (R>L), Chronic pain of lower extremity, unspecified laterality, Chronic groin pain (Right), Malignant neoplasm of lung, unspecified laterality, unspecified part of lung ([place]), H/O malignant neoplasm of prostate, Malignant neoplasm of prostate ([place]), Primary malignant neoplasm of liver ([facility]), Squamous cell carcinoma of lung, unspecified laterality ([facility]), Chronic pain, Hypomagnesemia, Insomnia secondary to chronic pain, Neuropathic pain, Neurogenic pain, Therapeutic opioid-induced constipation (OIC), Long term current use of opiate analgesic, Encounter for  therapeutic drug level monitoring, and Osteoporosis, idiopathic were also pertinent to this visit.  Visit Diagnosis: 1. Acute low back pain   2. Chronic low back pain (Location of Primary Source of Pain) (Bilateral) (R>L)   3. Chronic pain of lower extremity, unspecified laterality   4. Chronic groin pain (Right)   5. Malignant neoplasm of lung, unspecified laterality, unspecified part of lung ([place])   6. H/O malignant neoplasm of prostate   7. Malignant neoplasm of prostate ([place])   8. Primary malignant neoplasm of liver ([place])   9. Squamous cell carcinoma of lung, unspecified laterality ([place])   10. Chronic pain   11. Hypomagnesemia   12. Insomnia secondary to chronic pain   13. Neuropathic pain   14. Neurogenic pain   15. Therapeutic opioid-induced constipation (OIC)   16. Long term current use of opiate analgesic   17. Encounter for therapeutic drug level monitoring   18. Osteoporosis, idiopathic     Problem-specific Plan(s): No problem-specific assessment & plan notes found for this encounter.   Plan of Care  Pharmacotherapy (Medications Ordered): Meds ordered this encounter  Medications  . DISCONTD: ropivacaine (PF) 2 mg/ml (0.2%) (NAROPIN) 2 MG/ML epidural    Sig:     TICE, KORI: cabinet override  . DISCONTD: triamcinolone acetonide (KENALOG-40) 40 MG/ML injection    Sig:     TICE, KORI: cabinet override  . oxyCODONE (ROXICODONE) 15 MG immediate release tablet    Sig: Take 1 tablet (15 mg total) by mouth every 6 (six) hours as needed for pain.    Dispense:  120 tablet    Refill:  0    Do not place this medication, or any other prescription from our practice, on "Automatic Refill". Patient may have prescription filled one day early if pharmacy is closed on scheduled refill date. Do not  fill until: 09/14/15 To last until: 10/14/15  . oxyCODONE (ROXICODONE) 15 MG immediate release tablet    Sig: Take 1 tablet (15 mg total) by mouth every 6 (six) hours as needed for pain.     Dispense:  120 tablet    Refill:  0    Do not place this medication, or any other prescription from our practice, on "Automatic Refill". Patient may have prescription filled one day early if pharmacy is closed on scheduled refill date. Do not fill until: 10/14/15 To last until: 11/13/15  . oxyCODONE (ROXICODONE) 15 MG immediate release tablet    Sig: Take 1 tablet (15 mg total) by mouth every 6 (six) hours as needed for pain.    Dispense:  120 tablet    Refill:  0    Do not place this medication, or any other prescription from our practice, on "Automatic Refill". Patient may have prescription filled one day early if pharmacy is closed on scheduled refill date. Do not fill until: 11/13/15 To last until: 12/13/15  . Magnesium 400 MG CAPS    Sig: Take 400 mg by mouth daily as needed.    Dispense:  30 capsule    Refill:  2    Do not place this medication, or any other prescription from our practice, on "Automatic Refill". Patient may have prescription filled one day early if pharmacy is closed on scheduled refill date.  . Melatonin 10 MG CAPS    Sig: Take 1 tablet by mouth at bedtime.    Dispense:  30 capsule    Refill:  2    Do not place this medication, or any other prescription from our practice, on "Automatic Refill". Patient may have prescription filled one day early if pharmacy is closed on scheduled refill date.  . gabapentin (NEURONTIN) 800 MG tablet    Sig: Take 1 tablet (800 mg total) by mouth 3 (three) times daily.    Dispense:  90 tablet    Refill:  2    Do not place this medication, or any other prescription from our practice, on "Automatic Refill". Patient may have prescription filled one day early if pharmacy is closed on scheduled refill date.  . bisacodyl (DULCOLAX) 5 MG EC tablet    Sig: Take 2 tablets (10 mg total) by mouth at bedtime as needed for moderate constipation ((Hold for loose stool)).    Dispense:  100 tablet    Refill:  PRN    Do not place this  medication, or any other prescription from our practice, on "Automatic Refill". Patient may have prescription filled one day early if pharmacy is closed on scheduled refill date.  . docusate sodium (COLACE) 100 MG capsule    Sig: Take 2 capsules (200 mg total) by mouth at bedtime as needed for moderate constipation. Do not use longer than 7 days.    Dispense:  60 capsule    Refill:  PRN    Do not place this medication, or any other prescription from our practice, on "Automatic Refill". Patient may have prescription filled one day early if pharmacy is closed on scheduled refill date.  . Wheat Dextrin (BENEFIBER) POWD    Sig: Stir 2 tsp. TID into 4-8 oz of any non-carbonated beverage or soft food (hot or cold)    Dispense:  500 g    Refill:  PRN    Do not place this medication, or any other prescription from our practice, on "Automatic Refill". Patient may have prescription filled  one day early if pharmacy is closed on scheduled refill date.  . methylPREDNISolone (MEDROL) 4 MG TBPK tablet    Sig: Follow package instructions.    Dispense:  21 tablet    Refill:  0    Do not place this medication, or any other prescription from our practice, on "Automatic Refill". Patient may have prescription filled one day early if pharmacy is closed on scheduled refill date.    Lab-work & Procedure Ordered: Orders Placed This Encounter  Procedures  . DG Lumbar Spine Complete W/Bend    Standing Status: Future     Number of Occurrences: 1     Standing Expiration Date: 08/20/2016    Scheduling Instructions:     Please include flexion and extension views and report any spinal instability (>4 mm displacement of any spondylolisthesis). If present, please report any spondylolisthesis grade, as well as displacement in millimeters.    Order Specific Question:  Reason for Exam (SYMPTOM  OR DIAGNOSIS REQUIRED)    Answer:  Low back pain    Order Specific Question:  Preferred imaging location?    Answer:  [facility]    Order Specific Question:  Call Results- Best Contact Number?    Answer:  [phone] (Pain Clinic facility) (Dr. Dossie Arbour)  . MR Lumbar Spine Wo Contrast    Standing Status: Future     Number of Occurrences:      Standing Expiration Date: 08/20/2016    Scheduling Instructions:     Please provide canal diameter in millimeters when describing any spinal stenosis.    Order Specific Question:  Reason for Exam (SYMPTOM  OR DIAGNOSIS REQUIRED)    Answer:  Lumbar radiculopathy/radiculitis    Order Specific Question:  Preferred imaging location?    Answer:  [facility]    Order Specific Question:  Does the patient have a pacemaker or implanted devices?    Answer:  No    Order Specific Question:  What is the patient's sedation requirement?    Answer:  No Sedation    Order Specific Question:  Call Results- Best Contact Number?    Answer:  [phone] (Pain Clinic facility) (Dr. Dossie Arbour)  . NM Bone Marrow Scan Whole Body    Standing Status: Future     Number of Occurrences:      Standing Expiration Date: 10/20/2016    Order Specific Question:  Reason for Exam (SYMPTOM  OR DIAGNOSIS REQUIRED)    Answer:  Acute on chronic low back pain    Order Specific Question:  Preferred imaging location?    Answer:  [facility]    Order Specific Question:  If indicated for the ordered procedure, I authorize the administration of a radiopharmaceutical per Radiology protocol    Answer:  Yes  . ToxASSURE Select 13 (MW), Urine    Volume: 30 ml(s). Minimum 3 ml of urine is needed. Document temperature of fresh sample. Indications: Long term (current) use of opiate analgesic (Z79.891)  . Comprehensive metabolic panel    Standing Status: Future     Number of Occurrences: 1     Standing Expiration Date: 09/20/2015    Order Specific Question:  Has the patient fasted?    Answer:  No  . C-reactive protein    Standing Status: Future     Number of Occurrences: 1     Standing Expiration  Date: 09/20/2015  . Magnesium    Standing Status: Future     Number of Occurrences: 1  Standing Expiration Date: 09/20/2015  . Sedimentation rate    Standing Status: Future     Number of Occurrences: 1     Standing Expiration Date: 09/20/2015  . Vitamin B12    Indication: Bone Pain (M89.9)    Standing Status: Future     Number of Occurrences: 1     Standing Expiration Date: 09/20/2015  . Vitamin D pnl(25-hydrxy+1,25-dihy)-bld    Standing Status: Future     Number of Occurrences: 1     Standing Expiration Date: 09/20/2015  . PSA, total and free  . Testosterone,Free and Total    Standing Status: Future     Number of Occurrences: 1     Standing Expiration Date: 09/20/2015    Imaging Ordered: MR LUMBAR SPINE WO CONTRAST NM BONE MARROW SCAN WHOLE BODY  Interventional Therapies: Scheduled: None at this point. PRN Procedures: None at this point.    Referral(s) or Consult(s): None at this point.  Medications administered during this visit: [name] had no medications administered during this visit.  Future Appointments Date Time Provider [place]  11/20/2015 1:00 PM Milinda Pointer, MD [facility] None    Primary Care Physician: Elyse Jarvis, MD Location: [facility] Note by: Kathlen Brunswick. Dossie Arbour, M.D, DABA, DABAPM, DABPM, DABIPP, FIPP  Pain Score Disclaimer: We use the NRS-11 scale. This is a self-reported, subjective measurement of pain severity with only modest accuracy. It is used primarily to identify changes within a particular patient. It must be understood that outpatient pain scales are significantly less accurate that those used for research, where they can be applied under ideal controlled circumstances with minimal exposure to variables. In reality, the score is likely to be a combination of pain intensity and pain affect, where pain affect describes the degree of emotional arousal or changes in action readiness caused by the  sensory experience of pain. Factors such as social and work situation, setting, emotional state, anxiety levels, expectation, and prior pain experience may influence pain perception and show large inter-individual differences that may also be affected by time variables.

## 2015-08-22 LAB — TESTOSTERONE,FREE AND TOTAL
TESTOSTERONE: 551 ng/dL (ref 348–1197)
Testosterone, Free: 2 pg/mL — ABNORMAL LOW (ref 6.6–18.1)

## 2015-08-22 LAB — PSA, TOTAL AND FREE
PSA, Free Pct: UNDETERMINED %
PSA, Free: <0.01 ng/mL
Prostate Specific Ag, Serum: <0.1 ng/mL (ref 0.0–4.0)

## 2015-08-22 LAB — VITAMIN D PNL(25-HYDRXY+1,25-DIHY)-BLD
VIT D 25 HYDROXY: 29.2 ng/mL — AB (ref 30.0–100.0)
Vit D, 1,25-Dihydroxy: 45.5 pg/mL (ref 19.9–79.3)

## 2015-08-22 NOTE — Progress Notes (Signed)
Quick Note:  The results of this x-ray did not show pathology requiring immediate surgical intervention. However, the anterolisthesis can certainly be a source of low back pain secondary to the changes in the anatomical relation between the facet joints. In addition, it can contribute to a central spinal stenosis although a 2 mm displacement is not very significant and it would fall into the category of a grade 1 anterolisthesis. ______

## 2015-08-22 NOTE — Progress Notes (Signed)
Quick Note:  Lab results reviewed and found to be within normal limits. ______ 

## 2015-08-27 LAB — TOXASSURE SELECT 13 (MW), URINE: PDF: 0

## 2015-08-29 ENCOUNTER — Other Ambulatory Visit: Payer: Self-pay | Admitting: Pain Medicine

## 2015-08-29 ENCOUNTER — Ambulatory Visit: Payer: Medicare Other | Attending: Pain Medicine | Admitting: Pain Medicine

## 2015-08-29 ENCOUNTER — Encounter: Payer: Self-pay | Admitting: Pain Medicine

## 2015-08-29 VITALS — BP 123/74 | HR 84 | Temp 98.2°F | Resp 16 | Ht 66.0 in | Wt 180.0 lb

## 2015-08-29 DIAGNOSIS — M47812 Spondylosis without myelopathy or radiculopathy, cervical region: Secondary | ICD-10-CM | POA: Diagnosis not present

## 2015-08-29 DIAGNOSIS — Z85118 Personal history of other malignant neoplasm of bronchus and lung: Secondary | ICD-10-CM | POA: Diagnosis not present

## 2015-08-29 DIAGNOSIS — E559 Vitamin D deficiency, unspecified: Secondary | ICD-10-CM | POA: Insufficient documentation

## 2015-08-29 DIAGNOSIS — M545 Low back pain, unspecified: Secondary | ICD-10-CM

## 2015-08-29 DIAGNOSIS — F329 Major depressive disorder, single episode, unspecified: Secondary | ICD-10-CM | POA: Insufficient documentation

## 2015-08-29 DIAGNOSIS — B182 Chronic viral hepatitis C: Secondary | ICD-10-CM | POA: Insufficient documentation

## 2015-08-29 DIAGNOSIS — M5412 Radiculopathy, cervical region: Secondary | ICD-10-CM

## 2015-08-29 DIAGNOSIS — M818 Other osteoporosis without current pathological fracture: Secondary | ICD-10-CM | POA: Diagnosis not present

## 2015-08-29 DIAGNOSIS — K7460 Unspecified cirrhosis of liver: Secondary | ICD-10-CM | POA: Diagnosis not present

## 2015-08-29 DIAGNOSIS — F1290 Cannabis use, unspecified, uncomplicated: Secondary | ICD-10-CM | POA: Insufficient documentation

## 2015-08-29 DIAGNOSIS — I10 Essential (primary) hypertension: Secondary | ICD-10-CM | POA: Diagnosis not present

## 2015-08-29 DIAGNOSIS — M542 Cervicalgia: Secondary | ICD-10-CM | POA: Diagnosis present

## 2015-08-29 DIAGNOSIS — F1020 Alcohol dependence, uncomplicated: Secondary | ICD-10-CM | POA: Insufficient documentation

## 2015-08-29 DIAGNOSIS — M25561 Pain in right knee: Secondary | ICD-10-CM | POA: Diagnosis not present

## 2015-08-29 DIAGNOSIS — Z8546 Personal history of malignant neoplasm of prostate: Secondary | ICD-10-CM | POA: Diagnosis not present

## 2015-08-29 DIAGNOSIS — F419 Anxiety disorder, unspecified: Secondary | ICD-10-CM | POA: Diagnosis not present

## 2015-08-29 DIAGNOSIS — N529 Male erectile dysfunction, unspecified: Secondary | ICD-10-CM | POA: Insufficient documentation

## 2015-08-29 DIAGNOSIS — M25562 Pain in left knee: Secondary | ICD-10-CM | POA: Insufficient documentation

## 2015-08-29 DIAGNOSIS — Z8505 Personal history of malignant neoplasm of liver: Secondary | ICD-10-CM | POA: Diagnosis not present

## 2015-08-29 DIAGNOSIS — M47816 Spondylosis without myelopathy or radiculopathy, lumbar region: Secondary | ICD-10-CM | POA: Diagnosis not present

## 2015-08-29 DIAGNOSIS — J449 Chronic obstructive pulmonary disease, unspecified: Secondary | ICD-10-CM | POA: Diagnosis not present

## 2015-08-29 DIAGNOSIS — D649 Anemia, unspecified: Secondary | ICD-10-CM | POA: Diagnosis not present

## 2015-08-29 DIAGNOSIS — G8929 Other chronic pain: Secondary | ICD-10-CM | POA: Diagnosis not present

## 2015-08-29 DIAGNOSIS — Q762 Congenital spondylolisthesis: Secondary | ICD-10-CM

## 2015-08-29 DIAGNOSIS — K5790 Diverticulosis of intestine, part unspecified, without perforation or abscess without bleeding: Secondary | ICD-10-CM | POA: Diagnosis not present

## 2015-08-29 DIAGNOSIS — C801 Malignant (primary) neoplasm, unspecified: Secondary | ICD-10-CM | POA: Insufficient documentation

## 2015-08-29 DIAGNOSIS — F1190 Opioid use, unspecified, uncomplicated: Secondary | ICD-10-CM

## 2015-08-29 DIAGNOSIS — M4316 Spondylolisthesis, lumbar region: Secondary | ICD-10-CM | POA: Insufficient documentation

## 2015-08-29 DIAGNOSIS — Z87891 Personal history of nicotine dependence: Secondary | ICD-10-CM | POA: Diagnosis not present

## 2015-08-29 DIAGNOSIS — M25519 Pain in unspecified shoulder: Secondary | ICD-10-CM | POA: Diagnosis not present

## 2015-08-29 DIAGNOSIS — M549 Dorsalgia, unspecified: Secondary | ICD-10-CM | POA: Diagnosis present

## 2015-08-29 DIAGNOSIS — M4310 Spondylolisthesis, site unspecified: Secondary | ICD-10-CM

## 2015-08-29 DIAGNOSIS — R7989 Other specified abnormal findings of blood chemistry: Secondary | ICD-10-CM | POA: Insufficient documentation

## 2015-08-29 MED ORDER — NALOXONE HCL 4 MG/0.1ML NA LIQD: 1.0000 | Freq: Once | NASAL | Status: AC

## 2015-08-29 MED ORDER — VITAMIN D3 50 MCG (2000 UT) PO CAPS: ORAL_CAPSULE | ORAL | Status: DC

## 2015-08-29 MED ORDER — VITAMIN D (ERGOCALCIFEROL) 1.25 MG (50000 UNIT) PO CAPS: ORAL_CAPSULE | ORAL | Status: DC

## 2015-08-29 NOTE — Patient Instructions (Signed)

## 2015-08-29 NOTE — Progress Notes (Signed)
Quick Note:   Normal fasting (NPO x 8 hours) glucose levels are between 65-99 mg/dl, with 2 hour fasting, levels are usually less than 140 mg/dl. Any random blood glucose level greater than 200 mg/dl is considered to be Diabetes.  Normal ALP (Alkaline phosphatase) levels are between 35 -105 IU/L, for our Lab. High ALP could suggest liver damage or increased bone cell activity. If other tests such as bilirubin, aspartate aminotransferase (AST), or alanine aminotransferase (ALT) are also high, usually the increased ALP is coming from the liver. Higher-than-normal ALP levels can be seen with: biliary obstruction; bone conditions; osteoblastic bone tumors; osteomalacia; a healing fracture; liver disease; hepatitis; eating a fatty meal if you have blood type O or B; hyperparathyroidism; leukemia; lymphoma; Paget disease; rickets; and/or sarcoidosis.  ______

## 2015-08-29 NOTE — Progress Notes (Signed)

## 2015-08-29 NOTE — Progress Notes (Signed)
Quick Note:   Testosterone may decrease more in men who are obese or chronically ill and with the use of certain medications, such as opioids.  Chronic use of opioids will lead to endocrinopathies, such as androgen deficiency and therefore referred to as opioid associated androgen deficiency (OPIAD).  Symptoms that may manifest in patients with OPIAD include reduced libido, erectile dysfunction, fatigue, hot flashes, and depression. Physical findings may include reduced facial and body hair, anemia, decreased muscle mass, weight gain, and osteopenia or osteoporosis. Additionally, both men and women with OPIAD may suffer from infertility.  Other causes of low testosterone level (hypogonadism) may include: 1. Hypothalamic or pituitary disease. 2. Genetic diseases that can cause decreased testosterone production in young men (Klinefelter, Kallman, and Prader-Willi syndromes) or testicular failure and infertility (as in myotonic dystrophy, a form of muscular dystrophy) 3. Impaired testosterone production because of acquired damage to the testes, such as from alcoholism, physical injury, or viral diseases like mumps. 4. Chronic disease, such as diabetes ______

## 2015-08-29 NOTE — Progress Notes (Signed)

## 2015-08-29 NOTE — Progress Notes (Signed)
Patient's Name: [facility]. MRN: [id number] DOB: 12-14-1945 DOS: 08/29/2015  Primary Reason(s) for Visit: Evaluation for follow-up after a diagnostic studies. CC: Neck Pain and Back Pain   HPI  Tyrone Dominguez is a 70 y.o. year old, male patient, who returns today as an established patient. 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[place]); Chronic pain syndrome; Arthropathy of shoulder (Left); Uncomplicated opioid dependence ([place]); Medical marijuana use; Chronic knee pain (Bilateral); Long term prescription opiate use; Cancer related pain; Cervicalgia; Cervical spondylosis without myelopathy; Chronic low back pain (Location of Primary Source of Pain) (Bilateral) (R>L); Long term current use of opiate analgesic; Opiate use (90 MME/Day); Encounter for therapeutic drug level monitoring; Chronic neck pain; Encounter for chronic pain management; Chronic pain; Neuropathic pain; Neurogenic pain; Therapeutic opioid-induced constipation (OIC); Hypomagnesemia; Insomnia secondary to chronic pain; Chronic lower extremity pain (Location of Secondary source of pain) (Bilateral) (R>L); Chronic groin pain (Right); Osteoporosis, idiopathic; Vitamin D insufficiency; Low testosterone; Grade 1 Anterolisthesis (2 mm) of L5 over S1 (stable); Lumbar spondylosis; and Radicular pain of shoulder on his problem list.. His primarily concern today is the Neck Pain and Back Pain   The  patient returns to the clinics today for follow-up evaluation after having Dominguez some lab work done and x-rays of the lumbar spine for his acute low back pain. Last time we provided him with a Medrol Dosepak. The results of the x-rays of the lumbar spine show a stable grade 1 anterolisthesis of L5 over S1. This is very likely to be related to the etiology of his low back pain as this will stress the facet joints. The patient indicates that the Medrol Dosepak worked great and almost completely eliminated the patient's low back pain. At this point we will have a prn order for diagnostic lumbar facet block under fluoroscopic guidance and IV sedation. In addition, the patient indicates that he still having problems with neck pain and shoulder pain and would like to have that reviewed. Review of the condition shows that he has cervical spondylosis with possible irritation of some cervical nerve root. I have offered him a cervical epidural steroid injection under fluoroscopic guidance, no sedation, should he need to do something about that.  Pain Assessment: Self-Reported Pain Score: 4  Reported level is compatible with observation Pain Type: Chronic pain Pain Location: Back Pain Orientation: Lower Pain Descriptors / Indicators: Aching, Nagging Pain Frequency: Constant  Date of Last Visit: 08/21/15 Service Provided on Last Visit: Med Refill (was given Predinsone at this appt)  Controlled Substance Pharmacotherapy Assessment  Analgesic: Oxycodone IR 15 mg every 6 hours (60 mg/day) MME/day: 90 mg/day Pharmacokinetics: Onset of action (Liberation/Absorption): Within expected pharmacological parameters Time to Peak effect (Distribution): Timing and results are as within normal expected parameters Duration of action (Metabolism/Excretion): Within normal limits for medication Pharmacodynamics: Analgesic Effect: More than 50% Activity Facilitation: Medication(s) allow patient to sit, stand, walk, and do the  basic ADLs Perceived Effectiveness: Described as relatively effective, allowing for increase in activities of daily living (ADL) Side-effects or Adverse reactions: None reported Monitoring: [place] PMP: Compliant  with practice rules and regulations UDS Results/interpretation: Last UDS done on 08/21/2015 came back within normal limits with no unexpected results. Medication Assessment Form: Reviewed. Patient indicates being compliant with therapy Treatment compliance: Compliant Risk Assessment: Aberrant Behavior: None observed today Substance Use Disorder (SUD) Risk Level: Low Opioid Risk Tool (ORT) Score: Total Score: 0 Low Risk for SUD (Score <3) Depression Scale Score: PHQ-2:   No depression (0) PHQ-9:   No depression (0-4)  Pharmacologic Plan: No change in therapy, at this time   Laboratory Workup  Last ED UDS: No results found for: THCU, COCAINSCRNUR, PCPSCRNUR, MDMA, AMPHETMU, METHADONE, ETOH  Inflammation Markers Lab Results  Component Value Date   ESRSEDRATE 45* 08/21/2015   CRP 0.7 08/21/2015    Renal Function Lab Results  Component Value Date   BUN 12 08/21/2015   CREATININE 0.89 08/21/2015   GFRAA >60 08/21/2015   GFRNONAA >60 08/21/2015    Hepatic Function Lab Results  Component Value Date   AST 32 08/21/2015   ALT 19 08/21/2015   ALBUMIN 3.6 08/21/2015    Electrolytes Lab Results  Component Value Date   NA 136 08/21/2015   K 4.1 08/21/2015   CL 101 08/21/2015   CALCIUM 9.1 08/21/2015   MG 1.9 08/21/2015    Allergies  Tyrone Dominguez is allergic to morphine and related and ciprofloxacin.  Meds  The patient has a current medication list which includes the following prescription(s): albuterol, bisacodyl, vitamin d-3, vitamin d3, diphenhydramine, diphenhydramine, ferrous sulfate, fluoxetine, furosemide, furosemide, gabapentin, levothyroxine, lisinopril, loratadine-pseudoephedrine, magnesium, melatonin, menaquinone-7, mometasone, omeprazole, oxycodone,  oxycodone, oxycodone, ra krill oil, senna-docusate, trospium, turmeric curcumin, vitamin d (ergocalciferol), and naloxone hcl.  Current Outpatient Prescriptions on File Prior to Visit  Medication Sig  . albuterol (PROVENTIL HFA;VENTOLIN HFA) 108 (90 BASE) MCG/ACT inhaler Inhale 2 puffs into the lungs every 6 (six) hours as needed for wheezing or shortness of breath.  . bisacodyl (DULCOLAX) 5 MG EC tablet Take 2 tablets (10 mg total) by mouth at bedtime as needed for moderate constipation ((Hold for loose stool)).  Marland Kitchen Cholecalciferol (VITAMIN D-3) 1000 UNITS CAPS Take 5 capsules by mouth daily.   . diphenhydrAMINE (BENADRYL) 25 mg capsule Take by mouth. Reported on 08/21/2015  . diphenhydrAMINE (SOMINEX) 25 MG tablet Take 50 mg by mouth at bedtime as needed for sleep. Reported on 08/29/2015  . ferrous sulfate 325 (65 FE) MG tablet Take 325 mg by mouth daily with breakfast.  . FLUoxetine (PROZAC) 20 MG capsule Take 40 mg by mouth daily. 40 mg qhs  . furosemide (LASIX) 20 MG tablet Take 20 mg by mouth daily.  . furosemide (LASIX) 40 MG tablet Take by mouth.  . gabapentin (NEURONTIN) 800 MG tablet Take 1 tablet (800 mg total) by mouth 3 (three) times daily.  Marland Kitchen levothyroxine (SYNTHROID, LEVOTHROID) 25 MCG tablet Take 25 mcg by mouth daily before breakfast.  . lisinopril (PRINIVIL,ZESTRIL) 20 MG tablet Take 20 mg by mouth daily.  Marland Kitchen loratadine-pseudoephedrine (CLARITIN-D 24-HOUR) 10-240 MG 24 hr tablet Take by mouth. Reported on 08/21/2015  . Magnesium 400 MG CAPS Take 400 mg by mouth daily as needed. (Patient taking differently: Take 500 mg by mouth daily as needed. )  . Melatonin 10 MG CAPS Take 1 tablet by mouth at bedtime.  . Menaquinone-7 (VITAMIN K2 PO) Take 100 mEq by mouth every morning.  . mometasone (ASMANEX) 220 MCG/INH inhaler Inhale 2 puffs into the lungs daily.  Marland Kitchen omeprazole (PRILOSEC) 40 MG capsule Take 20 mg by  mouth daily.   Marland Kitchen oxyCODONE (ROXICODONE) 15 MG immediate release tablet Take 1  tablet (15 mg total) by mouth every 6 (six) hours as needed for pain.  Marland Kitchen oxyCODONE (ROXICODONE) 15 MG immediate release tablet Take 1 tablet (15 mg total) by mouth every 6 (six) hours as needed for pain.  Marland Kitchen oxyCODONE (ROXICODONE) 15 MG immediate release tablet Take 1 tablet (15 mg total) by mouth every 6 (six) hours as needed for pain.  Marland Kitchen RA KRILL OIL 500 MG CAPS Take by mouth.  . senna-docusate (SENOKOT-S) 8.6-50 MG tablet Take by mouth.  . trospium (SANCTURA) 20 MG tablet   . Turmeric Curcumin 500 MG CAPS Take 800 mg by mouth.    No current facility-administered medications on file prior to visit.    ROS  Constitutional: Afebrile, no chills, well hydrated and well nourished Gastrointestinal: negative Musculoskeletal:negative Neurological: negative Behavioral/Psych: negative  PFSH  Medical:  Tyrone Dominguez  has a past medical history of Anxiety; Cancer ([place]); Arthritis; Depression; Anemia; and Swallowing difficulty. Family: family history includes Cancer in his mother; Diabetes in his father; Hypertension in his father; Lung disease in his father; Parkinson's disease in his father. Surgical:  has past surgical history that includes Wrist fracture surgery; Head & neck wound repair / closure; Hemorrhoid surgery; Tonsillectomy; Lung removal, partial; CT RADIATION THERAPY GUIDE; and Tumor removal (06/03/2015). Tobacco:  reports that he quit smoking about 9 years ago. His smoking use included Cigarettes. He has a 125 pack-year smoking history. He does not have any smokeless tobacco history on file. Alcohol:  reports that he does not drink alcohol. Drug:  reports that he does not use illicit drugs.  Physical Exam  Vitals:  Today's Vitals   08/29/15 1307 08/29/15 1308  BP: 123/74   Pulse: 84   Temp: 98.2 F (36.8 C)   TempSrc: Oral   Resp: 16   Height: '5\' 6"'$  (1.676 m)   Weight: 180 lb (81.647 kg)   SpO2: 98%   PainSc: 4  4     Calculated BMI: Body mass index is 29.07 kg/(m^2).  Overweight (25-29.9 kg/m2) - 20% higher incidence of chronic pain  General appearance: alert, cooperative, appears stated age and no distress Eyes: PERLA Respiratory: No evidence respiratory distress, no audible rales or ronchi and no use of accessory muscles of respiration  Lumbar Spine Exam  Inspection: No gross anomalies detected Alignment: Symetrical Palpation: WNL ROM:  Flexion: Adequate and non-contributory Extension: Adequate and non-contributory Lateral Bending: Adequate and non-contributory Rotation: Adequate and non-contributory Provocative Tests:  Lumbar Hyperextension and rotation test:  Positive for facet pain, bilaterally Patrick's Maneuver: deferred  Gait Evaluation  Gait: Antalgic (limping)  Lower Extremity Exam  Inspection: No gross anomalies detected ROM: Adequate Sensory:  Normal Motor: Unremarkable  Toe walk (S1): WNL  Heal walk (L5): WNL   Assessment & Plan  Primary Diagnosis & Pertinent Problem List: The primary encounter diagnosis was Chronic pain. Diagnoses of Chronic low back pain (Location of Primary Source of Pain) (Bilateral) (R>L), Grade 1 Anterolisthesis (2 mm) of L5 over S1 (stable), Opiate use (90 MME/Day), Chronic neck pain, Cervical spondylosis without myelopathy, Lumbar spondylosis, unspecified spinal osteoarthritis, and Radicular pain of shoulder were also pertinent to this visit.  Visit Diagnosis: 1. Chronic pain   2. Chronic low back pain (Location of Primary Source of Pain) (Bilateral) (R>L)   3. Grade 1 Anterolisthesis (2 mm) of L5 over S1 (stable)   4. Opiate use (90 MME/Day)   5. Chronic neck  pain   6. Cervical spondylosis without myelopathy   7. Lumbar spondylosis, unspecified spinal osteoarthritis   8. Radicular pain of shoulder     Problem-specific Plan(s): No problem-specific assessment & plan notes found for this encounter.   Plan of Care  Pharmacotherapy (Medications Ordered): Meds ordered this encounter  Medications   . Naloxone HCl (NARCAN) 4 MG/0.1ML LIQD    Sig: Place 1 spray into the nose once. Spray half of bottle content into each nostril, then call 911    Dispense:  2 each    Refill:  0    Please instruct patient in the proper use of the medication.    Lab-work & Procedure Ordered: Orders Placed This Encounter  Procedures  . CERVICAL EPIDURAL STEROID INJECTION    Standing Status: Standing     Number of Occurrences: 1     Standing Expiration Date: 08/28/2016    Scheduling Instructions:     Side: Midline     Sedation: No Sedation.     Timeframe: PRN Procedure. Patient will call to schedule.     Indication: Neck pain with or without upper extremity pain, numbness, or weakness. Cervical Spondylosis with or without cervical radicular pain.    Order Specific Question:  Where will this procedure be performed?    Answer:  [facility]  . LUMBAR FACET(MEDIAL BRANCH NERVE BLOCK) MBNB    Standing Status: Standing     Number of Occurrences: 1     Standing Expiration Date: 08/28/2016    Scheduling Instructions:     Side: Bilateral     Level: L2, L3, L4, L5, & S1 Medial Branch Nerve     Sedation: With Sedation.     Timeframe: PRN Procedure. Patient will call to schedule.    Order Specific Question:  Where will this procedure be performed?    Answer:  [facility]    Imaging Ordered: None  Interventional Therapies: Scheduled: None at this time. PRN Procedures:  1. For the low back pain, diagnostic bilateral lumbar facet block under fluoroscopic guidance and IV sedation.  2. For the neck pain and shoulder pain, cervical epidural steroid injection under fluoroscopic guidance, without IV sedation.    Referral(s) or Consult(s): None at this time.  Medications administered during this visit: Tyrone Dominguez no medications administered during this visit.  Future Appointments Date Time Provider [place]  11/20/2015 1:00 PM Milinda Pointer, MD [facility] None    Primary  Care Physician: Elyse Jarvis, MD Location: [facility] Note by: Kathlen Brunswick. Dossie Arbour, M.D, DABA, DABAPM, DABPM, DABIPP, FIPP  Pain Score Disclaimer: We use the NRS-11 scale. This is a self-reported, subjective measurement of pain severity with only modest accuracy. It is used primarily to identify changes within a particular patient. It must be understood that outpatient pain scales are significantly less accurate that those used for research, where they can be applied under ideal controlled circumstances with minimal exposure to variables. In reality, the score is likely to be a combination of pain intensity and pain affect, where pain affect describes the degree of emotional arousal or changes in action readiness caused by the sensory experience of pain. Factors such as social and work situation, setting, emotional state, anxiety levels, expectation, and prior pain experience may influence pain perception and show large inter-individual differences that may also be affected by time variables.

## 2015-08-29 NOTE — Progress Notes (Signed)
Safety precautions to be maintained throughout the outpatient stay will include: orient to surroundings, keep bed in low position, maintain call bell within reach at all times, provide assistance with transfer out of bed and ambulation.  Pt here today to review Lab work and xrays- Pt states relief from [employer] given last appt

## 2015-08-29 NOTE — Progress Notes (Signed)
Quick Note:  Lab results reviewed and found to be within normal limits. ______ 

## 2015-09-16 ENCOUNTER — Ambulatory Visit: Payer: Medicare Other | Admitting: Pain Medicine

## 2015-11-20 ENCOUNTER — Ambulatory Visit: Payer: Medicare HMO | Attending: Pain Medicine | Admitting: Pain Medicine

## 2015-11-20 ENCOUNTER — Encounter: Payer: Self-pay | Admitting: Pain Medicine

## 2015-11-20 VITALS — BP 113/72 | HR 84 | Temp 97.9°F | Resp 16 | Ht 66.0 in | Wt 175.0 lb

## 2015-11-20 DIAGNOSIS — C228 Malignant neoplasm of liver, primary, unspecified as to type: Secondary | ICD-10-CM

## 2015-11-20 DIAGNOSIS — G893 Neoplasm related pain (acute) (chronic): Secondary | ICD-10-CM | POA: Diagnosis not present

## 2015-11-20 DIAGNOSIS — M4310 Spondylolisthesis, site unspecified: Secondary | ICD-10-CM

## 2015-11-20 DIAGNOSIS — M47812 Spondylosis without myelopathy or radiculopathy, cervical region: Secondary | ICD-10-CM

## 2015-11-20 DIAGNOSIS — M25562 Pain in left knee: Secondary | ICD-10-CM

## 2015-11-20 DIAGNOSIS — M47816 Spondylosis without myelopathy or radiculopathy, lumbar region: Secondary | ICD-10-CM

## 2015-11-20 DIAGNOSIS — Q762 Congenital spondylolisthesis: Secondary | ICD-10-CM

## 2015-11-20 DIAGNOSIS — R1011 Right upper quadrant pain: Secondary | ICD-10-CM

## 2015-11-20 DIAGNOSIS — R1031 Right lower quadrant pain: Secondary | ICD-10-CM

## 2015-11-20 DIAGNOSIS — M25512 Pain in left shoulder: Secondary | ICD-10-CM

## 2015-11-20 DIAGNOSIS — M79606 Pain in leg, unspecified: Secondary | ICD-10-CM

## 2015-11-20 DIAGNOSIS — M545 Low back pain, unspecified: Secondary | ICD-10-CM

## 2015-11-20 DIAGNOSIS — C61 Malignant neoplasm of prostate: Secondary | ICD-10-CM

## 2015-11-20 DIAGNOSIS — M25561 Pain in right knee: Secondary | ICD-10-CM

## 2015-11-20 DIAGNOSIS — Z79891 Long term (current) use of opiate analgesic: Secondary | ICD-10-CM

## 2015-11-20 DIAGNOSIS — F1190 Opioid use, unspecified, uncomplicated: Secondary | ICD-10-CM

## 2015-11-20 DIAGNOSIS — M792 Neuralgia and neuritis, unspecified: Secondary | ICD-10-CM

## 2015-11-20 DIAGNOSIS — G894 Chronic pain syndrome: Secondary | ICD-10-CM

## 2015-11-20 DIAGNOSIS — G8929 Other chronic pain: Secondary | ICD-10-CM | POA: Insufficient documentation

## 2015-11-20 DIAGNOSIS — R1010 Upper abdominal pain, unspecified: Secondary | ICD-10-CM

## 2015-11-20 DIAGNOSIS — M542 Cervicalgia: Secondary | ICD-10-CM

## 2015-11-20 DIAGNOSIS — R109 Unspecified abdominal pain: Secondary | ICD-10-CM

## 2015-11-20 MED ORDER — OXYCODONE HCL 15 MG PO TABS: 15.0000 mg | ORAL_TABLET | Freq: Four times a day (QID) | ORAL | Status: DC | PRN

## 2015-11-20 NOTE — Patient Instructions (Signed)
A prescription for OXYCODONE X 3 was given to you today.  Knee Injection A knee injection is a procedure to get medicine into your knee joint. Your health care provider puts a needle into the joint and injects medicine with an attached syringe. The injected medicine may relieve the pain, swelling, and stiffness of arthritis. The injected medicine may also help to lubricate and cushion your knee joint. You may need more than one injection. LET [facility] CARE PROVIDER KNOW ABOUT:  Any allergies you have.  All medicines you are taking, including vitamins, herbs, eye drops, creams, and over-the-counter medicines.  Previous problems you or members of your family have had with the use of anesthetics.  Any blood disorders you have.  Previous surgeries you have had.  Any medical conditions you may have. RISKS AND COMPLICATIONS Generally, this is a safe procedure. However, problems may occur, including:  Infection.  Bleeding.  Worsening symptoms.  Damage to the area around your knee.  Allergic reaction to any of the medicines.  Skin reactions from repeated injections. BEFORE THE PROCEDURE  Ask your health care provider about changing or stopping your regular medicines. This is especially important if you are taking diabetes medicines or blood thinners.  Plan to have someone take you home after the procedure. PROCEDURE  You will sit or lie down in a position for your knee to be treated.  The skin over your kneecap will be cleaned with a germ-killing solution (antiseptic).  You will be given a medicine that numbs the area (local anesthetic). You may feel some stinging.  After your knee becomes numb, you will have a second injection. This is the medicine. This needle is carefully placed between your kneecap and your knee. The medicine is injected into the joint space.  At the end of the procedure, the needle will be removed.  A bandage (dressing) may be placed over the injection  site. The procedure may vary among health care providers and hospitals. AFTER THE PROCEDURE  You may have to move your knee through its full range of motion. This helps to get all of the medicine into your joint space.  Your blood pressure, heart rate, breathing rate, and blood oxygen level will be monitored often until the medicines you were given have worn off.  You will be watched to make sure that you do not have a reaction to the injected medicine.   This information is not intended to replace advice given to you by your health care provider. Make sure you discuss any questions you have with your health care provider.   Document Released: 08/23/2006 Document Revised: 06/22/2014 Document Reviewed: 04/11/2014 Elsevier Interactive Patient Education [employer].

## 2015-11-20 NOTE — Progress Notes (Signed)
Is to have radiation on his liver soon for liver mass.  Pill count: Oxycodone 96/120; filled 11/14/15

## 2015-11-20 NOTE — Progress Notes (Signed)
Patient's Name: Tyrone Dominguez.  Patient type: Established  MRN: [id number]  Service setting: Ambulatory outpatient  DOB: 11-20-1945  Location: [facility]  DOS: 11/20/2015  Primary Care Physician: Elyse Jarvis, MD  Note by: Kathlen Brunswick. Dossie Arbour, M.D, DABA, DABAPM, DABPM, Milagros Evener, FIPP  Referring Physician: Theotis Burrow*  Specialty: Board-Certified Interventional Pain Management  Last Visit to Pain Management: 08/29/2015   Primary Reason(s) for Visit: Encounter for prescription drug management (Level of risk: moderate) CC: Knee Pain; Neck Pain; and Back Pain   HPI  Mr. Mata is a 70 y.o. year old, male patient, who returns today as an established patient. 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) ([place]); Chronic pain syndrome; Medical marijuana use; Chronic knee pain (Bilateral) (L>R); Long term prescription opiate use; Cancer related pain; Chronic low back pain (Location of Primary Source of Pain) (Bilateral) (R>L); Long term current use of opiate analgesic; Opiate use (90 MME/Day); Encounter for therapeutic drug level monitoring; Chronic neck pain; Encounter for chronic pain management; Chronic pain; Neurogenic pain; Opioid-induced constipation (OIC); Hypomagnesemia; Insomnia secondary to chronic pain; Chronic lower extremity pain (Location of Secondary source of pain) (Bilateral) (R>L); Chronic groin pain (Right); Osteoporosis, idiopathic; Vitamin D insufficiency; Low testosterone; Grade 1 Anterolisthesis (2 mm) of L5 over S1 (stable); Lumbar  spondylosis; Lumbar facet syndrome (Location of Primary Source of Pain) (Bilateral) (R>L); Cervical spondylosis; Chronic shoulder pain (Left); and Chronic abdominal pain (RUQ) on his problem list.. His primarily concern today is the Knee Pain; Neck Pain; and Back Pain   Pain Assessment: Self-Reported Pain Score: 2  Reported level is compatible with observation Pain Type: Chronic pain Pain Location: Knee Pain Orientation: Left Pain Descriptors / Indicators: Aching Pain Frequency: Intermittent  The patient comes into the clinics today for pharmacological management of his chronic pain. I last saw this patient on 08/29/2015. The patient  reports that he does not use illicit drugs. His body mass index is 28.26 kg/(m^2).  Date of Last Visit: 08/29/15 Service Provided on Last Visit: Evaluation  Controlled Substance Pharmacotherapy Assessment & REMS (Risk Evaluation and Mitigation Strategy)  Analgesic: Oxycodone IR 15 mg every 6 hours (60 mg/day) Pill Count: Oxycodone 96/120; filled 11/14/15 MME/day: 90 mg/day Pharmacokinetics: Onset of action (Liberation/Absorption): Within expected pharmacological parameters Time to Peak effect (Distribution): Timing and results are as within normal expected parameters Duration of action (Metabolism/Excretion): Within normal limits for medication Pharmacodynamics: Analgesic Effect: More than 50% Activity Facilitation: Medication(s) allow patient to sit, stand, walk, and do the basic ADLs Perceived Effectiveness: Described as relatively effective, allowing for increase in activities of daily living (ADL) Side-effects or Adverse reactions: None reported Monitoring: [place] PMP: Online review of the past 63-monthperiod conducted. Compliant with practice rules and regulations Last UDS on record: TOXASSURE SELECT 13  Date Value Ref Range Status  08/21/2015 FINAL  Final    Comment:    ==================================================================== TOXASSURE  SELECT 13 (MW) ==================================================================== Specimen Alert Note:  Urinary creatinine is low; ability to detect some drugs may be compromised.  Interpret results with caution. ==================================================================== Test  Result       Flag       Units Drug Present and Declared for Prescription Verification   Oxycodone                      8675         EXPECTED   ng/mg creat   Noroxycodone                   21244        EXPECTED   ng/mg creat    Sources of oxycodone include scheduled prescription medications.    Noroxycodone is an expected metabolite of oxycodone. ==================================================================== Test                      Result    Flag   Units      Ref Range   Creatinine              16        L      mg/dL      >=20 ==================================================================== Declared Medications:  The flagging and interpretation on this report are based on the  following declared medications.  Unexpected results may arise from  inaccuracies in the declared medications.  **Note: The testing scope of this panel includes these medications:  Oxycodone (Roxicodone)  **Note: The testing scope of this panel does not include following  reported medications:  Albuterol  Amoxicillin (Augmentin)  Azithromycin  Bisacodyl  Cholecalciferol  Clavulinate (Augmentin)  Diphenhydramine  Diphenhydramine (Benadryl)  Docusate  Docusate (Colace)  Fluoxetine (Prozac)  Furosemide (Lasix)  Gabapentin (Neurontin)  Hydrochlorothiazide  Iron (Ferrous Sulfate)  Levothyroxine  Lisinopril  Loratadine (Claritin-D)  Magnesium  Melatonin  Mometasone (Asmanex)  Multivitamin  Naproxen  Nystatin (Mycostatin)  Omeprazole  Oxybutynin  Pseudoephedrine (Claritin-D)  Sennosides  Supplement  Supplement (Krill Oil)  Tolterodine  Trospium (Sanctura)   Turmeric ==================================================================== For clinical consultation, please call [phone]. ====================================================================    UDS interpretation: Compliant Medication Assessment Form: Reviewed. Patient indicates being compliant with therapy Treatment compliance: Compliant Risk Assessment: Aberrant Behavior: None observed today Substance Use Disorder (SUD) Risk Level: No change since last visit Risk of opioid abuse or dependence: 0.7-3.0% with doses ? 36 MME/day and 6.1-26% with doses ? 120 MME/day. Opioid Risk Tool (ORT) Score: Total Score: 1 Low Risk for SUD (Score <3) Depression Scale Score: PHQ-2: PHQ-2 Total Score: 1 No depression (0) PHQ-9: PHQ-9 Total Score: 1 No depression (0-4)  Pharmacologic Plan: No change in therapy, at this time  Laboratory Chemistry  Inflammation Markers Lab Results  Component Value Date   ESRSEDRATE 45* 08/21/2015   CRP 0.7 08/21/2015    Renal Function Lab Results  Component Value Date   BUN 12 08/21/2015   CREATININE 0.89 08/21/2015   GFRAA >60 08/21/2015   GFRNONAA >60 08/21/2015    Hepatic Function Lab Results  Component Value Date   AST 32 08/21/2015   ALT 19 08/21/2015   ALBUMIN 3.6 08/21/2015    Electrolytes Lab Results  Component Value Date   NA 136 08/21/2015   K 4.1 08/21/2015   CL 101 08/21/2015   CALCIUM 9.1 08/21/2015   MG 1.9 08/21/2015    Pain Modulating Vitamins Lab Results  Component Value Date   VD25OH 29.2* 08/21/2015   VD125OH2TOT 45.5 08/21/2015   VITAMINB12 408 08/21/2015    Coagulation Parameters Lab Results  Component Value Date   PLT 292 03/29/2014  Note: Labs reviewed. Results made available to patient  Recent Diagnostic Imaging  Dg Lumbar Spine Complete W/bend  08/21/2015  CLINICAL DATA:  Back pain.  No known injury. EXAM: LUMBAR SPINE - COMPLETE WITH BENDING VIEWS COMPARISON:  CT 12/26/2013. FINDINGS: Diffuse  osteopenia degenerative change. No acute abnormality. Approximately 2 mm anterolisthesis L5 on S1 No flexion or extension deformity. Aortoiliac atherosclerotic vascular disease. IMPRESSION: 1. Diffuse osteopenia and degenerative change. Approximately 2 mm anterolisthesis L5-S1. No flexion or extension deformity. 2. Aortoiliac atherosclerotic vascular disease . Electronically Signed   By: Marcello Moores  Register   On: 08/21/2015 12:08   Cervical Imaging: Cervical MR wo contrast:  Results for orders placed in visit on 02/15/14  [place] W/O Cm   Narrative * PRIOR REPORT IMPORTED FROM AN EXTERNAL SYSTEM *   CLINICAL DATA:  Injury in 2014 with falling down the stairs.  Numbness LEFT hand. Stiffness while turning.   EXAM:  MRI CERVICAL SPINE WITHOUT CONTRAST   TECHNIQUE:  Multiplanar, multisequence MR imaging of the cervical spine was  performed. No intravenous contrast was administered.   COMPARISON:  01/02/2014 plain films. No prior MR of the cervical  spine is evident. MRI of the shoulder was performed 01/16/2014  demonstrating rotator cuff injury.   FINDINGS:  Severely degenerated cervical spine with marked reversal of the  normal cervical lordotic curve, apex at C4-5. There is severe disc  space narrowing with anterior and posterior spurring. There is  remodeling of the C5 vertebral body with end plate reactive changes  primarily above and below C4-C5.   Moderate to severe disc space narrowing at C5-6 and C6-7 with  significant cord compression, described below. Craniocervical  junction unremarkable. No definite abnormal cord signal. No  worrisome osseous lesions. Flow voids are maintained in the  BILATERAL vertebral arteries. No visible neck masses.   The individual disc spaces were examined as follows:   C2-3: 3 mm of facet mediated anterolisthesis. Mild annular bulging.  No central canal stenosis. BILATERAL neural foraminal narrowing of a  mild nature is worse on the RIGHT.     C3-4: 3 mm facet mediated anterolisthesis. Severe left-sided facet  arthropathy and uncinate spurring result in slight cord flattening  without absolute canal stenosis. There is severe left-sided  foraminal narrowing without visible canal for egress of the LEFT C4  nerve root.   C4-5: Severe disc space narrowing. Central disc osteophyte complex  results in mild canal stenosis and cord flattening. Canal diameter  7-8 mm. BILATERAL foraminal narrowing is multifactorial. Either C5  nerve root could be compressed.   C5-6: Moderate to severe disc space narrowing. Central disc  osteophyte complex with moderate canal stenosis. Moderate cord  flattening with RIGHT greater than LEFT uncinate spurring. No  definite abnormal cord signal. Severe RIGHT and moderate LEFT C6  nerve root encroachment likely.   C6-7: Moderate to severe disc space narrowing. Mild BILATERAL facet  arthropathy with moderate ligamentum flavum overgrowth. Moderate  central canal stenosis with canal diameter 6 mm. Cord flattening  without abnormal cord signal. BILATERAL foraminal narrowing could  affect either C7 nerve root.   C7-T1: Trace anterolisthesis. Mild bulge. Mild facet arthropathy. No  impingement.   IMPRESSION:  Multilevel spondylosis as described. Potentially symptomatic neural  impingement from C3 through C7. See discussion above    Electronically Signed    By: Rolla Flatten M.D.    On: 02/15/2014 16:24       Cervical DG complete:  Results for orders  placed in visit on 01/02/14  DG Cervical Spine Complete   Narrative * PRIOR REPORT IMPORTED FROM AN EXTERNAL SYSTEM *   CLINICAL DATA:  Generalized pain and history of fall.   EXAM:  CERVICAL SPINE  4+ VIEWS   COMPARISON:  None.   FINDINGS:  AP, lateral, odontoid and oblique images of the cervical spine were  obtained. There is acute kyphosis at C3-C4. Mild anterolisthesis at  C2-C3. Severe degenerative disc disease with disc space narrowing   from C4 through C7. Alignment at the cervicothoracic junction is  within normal limits. The prevertebral soft tissues are normal.  Narrowing of the neural foramen, particularly at C4-C5 and C5-C6. No  evidence for an acute fracture. The patient is edentulous.   IMPRESSION:  Severe degenerative changes in the mid and lower cervical spine.   Kyphosis at C3-C4.    Electronically Signed    By: Markus Daft M.D.    On: 01/03/2014 08:43       Shoulder Imaging: Shoulder-L MR wo contrast:  Results for orders placed in visit on 01/16/14  MR Shoulder Left Wo Contrast   Narrative * PRIOR REPORT IMPORTED FROM AN EXTERNAL SYSTEM *   CLINICAL DATA:  Right shoulder pain.  Remote fall history.   EXAM:  MRI OF THE LEFT SHOULDER WITHOUT CONTRAST   TECHNIQUE:  Multiplanar, multisequence MR imaging of the shoulder was performed.  No intravenous contrast was administered.   COMPARISON:  Radiographs 01/02/2014   FINDINGS:  Rotator cuff: Moderate rotator cuff tendinopathy/ tendinosis. There  are shallow articular surface tears involving the supraspinatus  tendon along with partial thickness tearing of the subscapularis  tendon. The articular fibers of the subscapularis tendon are torn  and retracted.   Muscles:  Fatty atrophy of the subscapularis muscle.   Acromioclavicular Joint: The distal clavicle is separated from the  acromion and slightly elevated. There is no stated history of a  distal clavicle resection and I think this is most likely a chronic  AC joint separation. The acromion is type 2 in shape. No lateral  downsloping or undersurface spurring.   Glenohumeral Joint: Mild degenerative changes. Small joint effusion  and mild synovitis.   Labrum: The superior labrum is torn. The anterior labrum is  moderately degenerated. The posterior labrum is intact.   Bones:  No acute bony findings.   IMPRESSION:  1. Moderate rotator cuff tendinopathy/tendinosis. There are shallow   articular surface tears involving the supraspinatus tendon.  2. High-grade partial tearing of the subscapularis tendon and  associated fatty atrophy of the muscle.  3. Torn and retracted long head biceps tendon and torn superior  labrum.  4. Suspect chronic AC joint separation.    Electronically Signed    By: Kalman Jewels M.D.    On: 01/16/2014 14:22       Shoulder-L DG:  Results for orders placed in visit on 01/02/14  DG Shoulder Left   Narrative * PRIOR REPORT IMPORTED FROM AN EXTERNAL SYSTEM *   CLINICAL DATA:  Chronic pain.   EXAM:  DG SHOULDER 3+VIEWS LEFT   COMPARISON:  Chest radiograph 02/01/2013   FINDINGS:  Negative for a fracture or dislocation. The left AC joint is intact.  Visualized left ribs are intact. No significant degenerative  disease.   IMPRESSION:  No acute bone abnormality to the left shoulder.    Electronically Signed    By: Markus Daft M.D.    On: 01/03/2014 08:37  Lumbar Imaging: Lumbar DG Bending views:  Results for orders placed during the hospital encounter of 08/21/15  DG Lumbar Spine Complete W/Bend   Narrative CLINICAL DATA:  Back pain.  No known injury.  EXAM: LUMBAR SPINE - COMPLETE WITH BENDING VIEWS  COMPARISON:  CT 12/26/2013.  FINDINGS: Diffuse osteopenia degenerative change. No acute abnormality. Approximately 2 mm anterolisthesis L5 on S1 No flexion or extension deformity. Aortoiliac atherosclerotic vascular disease.  IMPRESSION: 1. Diffuse osteopenia and degenerative change. Approximately 2 mm anterolisthesis L5-S1. No flexion or extension deformity.  2. Aortoiliac atherosclerotic vascular disease .   Electronically Signed   By: Marcello Moores  Register   On: 08/21/2015 12:08    Meds  The patient has a current medication list which includes the following prescription(s): albuterol, vitamin d3, diphenhydramine, ferrous sulfate, fluoxetine, gabapentin, levothyroxine, lisinopril, loratadine-pseudoephedrine,  magnesium, melatonin, mometasone, naloxone hcl, omeprazole, oxycodone, oxycodone, oxycodone, trospium, and turmeric.  Current Outpatient Prescriptions on File Prior to Visit  Medication Sig  . albuterol (PROVENTIL HFA;VENTOLIN HFA) 108 (90 BASE) MCG/ACT inhaler Inhale 2 puffs into the lungs every 6 (six) hours as needed for wheezing or shortness of breath.  . diphenhydrAMINE (BENADRYL) 25 mg capsule Take 50 mg by mouth at bedtime as needed. Reported on 08/21/2015  . ferrous sulfate 325 (65 FE) MG tablet Take 325 mg by mouth daily with breakfast.  . FLUoxetine (PROZAC) 20 MG capsule Take 40 mg by mouth daily. 40 mg qhs  . gabapentin (NEURONTIN) 800 MG tablet Take 1 tablet (800 mg total) by mouth 3 (three) times daily.  Marland Kitchen levothyroxine (SYNTHROID, LEVOTHROID) 25 MCG tablet Take 25 mcg by mouth daily before breakfast.  . lisinopril (PRINIVIL,ZESTRIL) 20 MG tablet Take 20 mg by mouth daily.  Marland Kitchen loratadine-pseudoephedrine (CLARITIN-D 24-HOUR) 10-240 MG 24 hr tablet Take 1 tablet by mouth as needed. Reported on 08/21/2015  . Melatonin 10 MG CAPS Take 1 tablet by mouth at bedtime.  . mometasone (ASMANEX) 220 MCG/INH inhaler Inhale 2 puffs into the lungs daily.  . Naloxone HCl (NARCAN) 4 MG/0.1ML LIQD Place 1 spray into the nose once. Spray half of bottle content into each nostril, then call 911  . trospium (SANCTURA) 20 MG tablet Take 20 mg by mouth 2 (two) times daily.    No current facility-administered medications on file prior to visit.    ROS  Constitutional: Denies any fever or chills Gastrointestinal: No reported hemesis, hematochezia, vomiting, or acute GI distress Musculoskeletal: Denies any acute onset joint swelling, redness, loss of ROM, or weakness Neurological: No reported episodes of acute onset apraxia, aphasia, dysarthria, agnosia, amnesia, paralysis, loss of coordination, or loss of consciousness  Allergies  Mr. Grunden is allergic to morphine and related and ciprofloxacin.  [place]    Medical:  Mr. Betzold  has a past medical history of Anxiety; Cancer ([place]); Arthritis; Depression; Anemia; and Swallowing difficulty. Family: family history includes Cancer in his mother; Diabetes in his father; Hypertension in his father; Lung disease in his father; Parkinson's disease in his father. Surgical:  has past surgical history that includes Wrist fracture surgery; Head & neck wound repair / closure; Hemorrhoid surgery; Tonsillectomy; Lung removal, partial; CT RADIATION THERAPY GUIDE; and Tumor removal (06/03/2015). Tobacco:  reports that he quit smoking about 9 years ago. His smoking use included Cigarettes. He has a 125 pack-year smoking history. He does not have any smokeless tobacco history on file. Alcohol:  reports that he does not drink alcohol. Drug:  reports that he does not use illicit drugs.  Constitutional Exam  Vitals: Blood pressure 113/72, pulse 84, temperature 97.9 F (36.6 C), temperature source Oral, resp. rate 16, height '5\' 6"'$  (1.676 m), weight 175 lb (79.379 kg), SpO2 100 %. General appearance: Well nourished, well developed, and well hydrated. In no acute distress Calculated BMI/Body habitus: Body mass index is 28.26 kg/(m^2). (25-29.9 kg/m2) Overweight - 20% higher incidence of chronic pain Psych/Mental status: Alert and oriented x 3 (person, place, & time) Eyes: PERLA Respiratory: No evidence of acute respiratory distress  Cervical Spine Exam  Inspection: No masses, redness, or swelling Alignment: Symmetrical ROM: Functional: Decreased ROM Stability: No instability detected Muscle strength & Tone: Functionally intact Sensory: Unimpaired Palpation: Tender  Upper Extremity (UE) Exam    Side: Right upper extremity  Side: Left upper extremity  Inspection: No masses, redness, swelling, or asymmetry  Inspection: No masses, redness, swelling, or asymmetry  ROM:  ROM:  Functional: ROM is within functional limits [facility])  Functional: ROM is within functional  limits [facility])  Muscle strength & Tone: Functionally intact  Muscle strength & Tone: Functionally intact  Sensory: Unimpaired  Sensory: Unimpaired  Palpation: Non-contributory  Palpation: Non-contributory   Thoracic Spine Exam  Inspection: No masses, redness, or swelling Alignment: Symmetrical ROM: Functional: ROM is within functional limits [facility]) Stability: No instability detected Sensory: Unimpaired Muscle strength & Tone: Functionally intact Palpation: No complaints of tenderness  Lumbar Spine Exam  Inspection: No masses, redness, or swelling Alignment: Symmetrical ROM: Functional: ROM is within functional limits [facility]) Stability: No instability detected Muscle strength & Tone: Functionally intact Sensory: Unimpaired Palpation: No complaints of tenderness Provocative Tests: Lumbar Hyperextension and rotation test: deferred Patrick's Maneuver: deferred  Gait & Posture Assessment  Ambulation: Patient ambulates using a cane Gait: Antalgic Posture: Antalgic  Lower Extremity Exam    Side: Right lower extremity  Side: Left lower extremity  Inspection: No masses, redness, swelling, or asymmetry ROM:  Inspection: No masses, redness, swelling, or asymmetry ROM:  Functional: ROM is within functional limits [facility])  Functional: Decreased ROM with the knee joint.   Muscle strength & Tone: Functionally intact  Muscle strength & Tone: Functionally intact  Sensory: Unimpaired  Sensory: Unimpaired  Palpation: Non-contributory  Palpation: Non-contributory   Assessment & Plan  Primary Diagnosis & Pertinent Problem List: The primary encounter diagnosis was Cancer related pain. Diagnoses of Long term current use of opiate analgesic, Chronic low back pain (Location of Primary Source of Pain) (Bilateral) (R>L), Grade 1 Anterolisthesis (2 mm) of L5 over S1 (stable), Lumbar spondylosis, unspecified spinal osteoarthritis, Chronic pain syndrome, Chronic pain, Lumbar facet syndrome, Chronic knee pain  (Bilateral) (L>R), Malignant neoplasm of prostate (HCC), Cervical spondylosis, Chronic shoulder pain (Left), Chronic abdominal pain (RUQ), Chronic groin pain (Right), Chronic pain of lower extremity, unspecified laterality, Chronic neck pain, Malignant neoplasm of liver ([place]), Neurogenic pain, and Opiate use (90 MME/Day) were also pertinent to this visit.  Visit Diagnosis: 1. Cancer related pain   2. Long term current use of opiate analgesic   3. Chronic low back pain (Location of Primary Source of Pain) (Bilateral) (R>L)   4. Grade 1 Anterolisthesis (2 mm) of L5 over S1 (stable)   5. Lumbar spondylosis, unspecified spinal osteoarthritis   6. Chronic pain syndrome   7. Chronic pain   8. Lumbar facet syndrome   9. Chronic knee pain (Bilateral) (L>R)   10. Malignant neoplasm of prostate ([place])   11. Cervical spondylosis   12. Chronic shoulder pain (Left)   13. Chronic abdominal pain (RUQ)  14. Chronic groin pain (Right)   15. Chronic pain of lower extremity, unspecified laterality   16. Chronic neck pain   17. Malignant neoplasm of liver ([place])   18. Neurogenic pain   19. 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  No new assessment & plan notes have been filed under this hospital service since the last note was generated. Service: Pain Management   Plan of Care   Problem List Items Addressed This Visit      High   Cancer related pain - Primary   Relevant  Orders   CELIAC PLEXUS BLOCK   Cervical spondylosis (Chronic)   Relevant Medications   oxyCODONE (ROXICODONE) 15 MG immediate release tablet   oxyCODONE (ROXICODONE) 15 MG immediate release tablet   oxyCODONE (ROXICODONE) 15 MG immediate release tablet   Other Relevant Orders   CERVICAL EPIDURAL STEROID INJECTION   CERVICAL FACET (MEDIAL BRANCH NERVE BLOCK)    Chronic abdominal pain (RUQ) (Chronic)   Relevant Orders   CELIAC PLEXUS BLOCK   Chronic groin pain (Right) (Chronic)   Relevant Medications   oxyCODONE (ROXICODONE) 15 MG immediate release tablet   oxyCODONE (ROXICODONE) 15 MG immediate release tablet   oxyCODONE (ROXICODONE) 15 MG immediate release tablet   Other Relevant Orders   SACROILIAC JOINT INJECTINS   Chronic knee pain (Bilateral) (L>R) (Chronic)   Relevant Medications   oxyCODONE (ROXICODONE) 15 MG immediate release tablet   oxyCODONE (ROXICODONE) 15 MG immediate release tablet   oxyCODONE (ROXICODONE) 15 MG immediate release tablet   Other Relevant Orders   KNEE INJECTION   GENICULAR NERVE BLOCK   Chronic low back pain (Location of Primary Source of Pain) (Bilateral) (R>L) (Chronic)   Relevant Medications   oxyCODONE (ROXICODONE) 15 MG immediate release tablet   oxyCODONE (ROXICODONE) 15 MG immediate release tablet   oxyCODONE (ROXICODONE) 15 MG immediate release tablet   Other Relevant Orders   LUMBAR FACET(MEDIAL BRANCH NERVE BLOCK) MBNB   SACROILIAC JOINT INJECTINS   Chronic lower extremity pain (Location of Secondary source of pain) (Bilateral) (R>L) (Chronic)   Relevant Orders   LUMBAR EPIDURAL STEROID INJECTION   Chronic neck pain (Chronic)   Relevant Medications   oxyCODONE (ROXICODONE) 15 MG immediate release tablet   oxyCODONE (ROXICODONE) 15 MG immediate release tablet   oxyCODONE (ROXICODONE) 15 MG immediate release tablet   Other Relevant Orders   CERVICAL EPIDURAL STEROID INJECTION   CERVICAL FACET (MEDIAL BRANCH NERVE BLOCK)    Chronic  pain (Chronic)   Relevant Medications   oxyCODONE (ROXICODONE) 15 MG immediate release tablet   oxyCODONE (ROXICODONE) 15 MG immediate release tablet   oxyCODONE (ROXICODONE) 15 MG immediate release tablet   Chronic pain syndrome   Chronic shoulder pain (Left) (Chronic)   Relevant Orders   SHOULDER INJECTION   SUPRASCAPULAR NERVE BLOCK   Grade 1 Anterolisthesis (2 mm) of L5 over S1 (stable) (Chronic)   Lumbar facet syndrome (Location of Primary Source of Pain) (Bilateral) (R>L) (Chronic)   Relevant Medications   oxyCODONE (  ROXICODONE) 15 MG immediate release tablet   oxyCODONE (ROXICODONE) 15 MG immediate release tablet   oxyCODONE (ROXICODONE) 15 MG immediate release tablet   Other Relevant Orders   LUMBAR FACET(MEDIAL BRANCH NERVE BLOCK) MBNB   Lumbar spondylosis (Chronic)   Relevant Medications   oxyCODONE (ROXICODONE) 15 MG immediate release tablet   oxyCODONE (ROXICODONE) 15 MG immediate release tablet   oxyCODONE (ROXICODONE) 15 MG immediate release tablet   Other Relevant Orders   LUMBAR FACET(MEDIAL BRANCH NERVE BLOCK) MBNB   Malignant neoplasm of liver (HCC)   Relevant Medications   oxyCODONE (ROXICODONE) 15 MG immediate release tablet   oxyCODONE (ROXICODONE) 15 MG immediate release tablet   oxyCODONE (ROXICODONE) 15 MG immediate release tablet   Malignant neoplasm of prostate (HCC)   Relevant Medications   oxyCODONE (ROXICODONE) 15 MG immediate release tablet   oxyCODONE (ROXICODONE) 15 MG immediate release tablet   oxyCODONE (ROXICODONE) 15 MG immediate release tablet   Neurogenic pain (Chronic)     Medium   Long term current use of opiate analgesic (Chronic)   Opiate use (90 MME/Day) (Chronic)       Pharmacotherapy (Medications Ordered): Meds ordered this encounter  Medications  . oxyCODONE (ROXICODONE) 15 MG immediate release tablet    Sig: Take 1 tablet (15 mg total) by mouth every 6 (six) hours as needed for pain.    Dispense:  120 tablet    Refill:  0     Do not place this medication, or any other prescription from our practice, on "Automatic Refill". Patient may have prescription filled one day early if pharmacy is closed on scheduled refill date. Do not fill until: 12/13/15 To last until: 01/12/16  . oxyCODONE (ROXICODONE) 15 MG immediate release tablet    Sig: Take 1 tablet (15 mg total) by mouth every 6 (six) hours as needed for pain.    Dispense:  120 tablet    Refill:  0    Do not place this medication, or any other prescription from our practice, on "Automatic Refill". Patient may have prescription filled one day early if pharmacy is closed on scheduled refill date. Do not fill until: 01/12/16 To last until: 02/11/16  . oxyCODONE (ROXICODONE) 15 MG immediate release tablet    Sig: Take 1 tablet (15 mg total) by mouth every 6 (six) hours as needed for pain.    Dispense:  120 tablet    Refill:  0    Do not place this medication, or any other prescription from our practice, on "Automatic Refill". Patient may have prescription filled one day early if pharmacy is closed on scheduled refill date. Do not fill until: 02/11/16 To last until: 03/12/16    [facility] & Procedure Ordered: Orders Placed This Encounter  Procedures  . LUMBAR FACET(MEDIAL BRANCH NERVE BLOCK) MBNB  . KNEE INJECTION  . CERVICAL EPIDURAL STEROID INJECTION  . CERVICAL FACET (MEDIAL BRANCH NERVE BLOCK)   . CELIAC PLEXUS BLOCK  . SACROILIAC JOINT INJECTINS  . GENICULAR NERVE BLOCK  . LUMBAR EPIDURAL STEROID INJECTION  . SHOULDER INJECTION  . SUPRASCAPULAR NERVE BLOCK    Imaging Ordered: None  Interventional Therapies: Scheduled:  None at this time.    Considering:   1. Diagnostic bilateral cervical facet block under fluoroscopic guidance and IV sedation.  2. Possible cervical facet radiofrequency ablation  3. Diagnostic right-sided celiac plexus block under fluoroscopic guidance and IV sedation.  4. Possible celiac plexus neurolysis.  5. Diagnostic  right-sided sacroiliac joint block under fluoroscopic guidance,  with or without sedation.  6. Possible right sided sacroiliac joint radiofrequency ablation.  7. Diagnostic bilateral intra-articular knee injection, without fluoroscopy or IV sedation.  8. Possible series of intra-articular Hyalgan knee injections without fluoroscopy or IV sedation.  9. Diagnostic bilateral genicular nerve block under fluoroscopic guidance and IV sedation.  10. Possible bilateral genicular nerve radiofrequency ablation.  11. Diagnostic bilateral lumbar facet block under fluoroscopic guidance and IV sedation.  12. Possible bilateral lumbar facet radiofrequency ablation.  13. Diagnostic right-sided L4-5 lumbar epidural steroid injection under fluoroscopic guidance, with or without sedation.  14. Diagnostic left sided cervical epidural steroid injection under fluoroscopic guidance, with or without sedation.  15. Diagnostic left sided intra-articular shoulder joint injection under fluoroscopic guidance, without sedation.  16. Diagnostic left sided suprascapular nerve block under fluoroscopic guidance, no sedation.  17. Possible left sided suprascapular nerve radiofrequency ablation under fluoroscopic guidance and IV sedation.    PRN Procedures:   1. Diagnostic bilateral cervical facet block under fluoroscopic guidance and IV sedation.  2. Diagnostic right-sided celiac plexus block under fluoroscopic guidance and IV sedation.  3. Diagnostic right-sided sacroiliac joint block under fluoroscopic guidance, with or without sedation.  4. Diagnostic bilateral intra-articular knee injection, without fluoroscopy or IV sedation.  5. Diagnostic bilateral genicular nerve block under fluoroscopic guidance and IV sedation.  6. Diagnostic bilateral lumbar facet block under fluoroscopic guidance and IV sedation.  7. Diagnostic right-sided L4-5 lumbar epidural steroid injection under fluoroscopic guidance, with or without sedation.    8. Diagnostic left sided cervical epidural steroid injection under fluoroscopic guidance, with or without sedation.  9. Diagnostic left sided intra-articular shoulder joint injection under fluoroscopic guidance, without sedation.  10. Diagnostic left sided suprascapular nerve block under fluoroscopic guidance, no sedation.    Referral(s) or Consult(s): None at this time.  New Prescriptions   No medications on file    Medications administered during this visit: Mr. Membreno had no medications administered during this visit.  Requested PM Follow-up: Return in about 3 months (around 02/24/2016) for Medication Management, (3-Mo), Procedure (PRN - Patient will call).  Future Appointments Date Time Provider [place]  02/24/2016 1:00 PM Milinda Pointer, MD [facility] None    Primary Care Physician: Elyse Jarvis, MD Location: [facility] Note by: Kathlen Brunswick. Dossie Arbour, M.D, DABA, DABAPM, DABPM, DABIPP, FIPP  Pain Score Disclaimer: We use the NRS-11 scale. This is a self-reported, subjective measurement of pain severity with only modest accuracy. It is used primarily to identify changes within a particular patient. It must be understood that outpatient pain scales are significantly less accurate that those used for research, where they can be applied under ideal controlled circumstances with minimal exposure to variables. In reality, the score is likely to be a combination of pain intensity and pain affect, where pain affect describes the degree of emotional arousal or changes in action readiness caused by the sensory experience of pain. Factors such as social and work situation, setting, emotional state, anxiety levels, expectation, and prior pain experience may influence pain perception and show large inter-individual differences that may also be affected by time variables.  Patient instructions provided during this appointment: Patient Instructions  A  prescription for OXYCODONE X 3 was given to you today.  Knee Injection A knee injection is a procedure to get medicine into your knee joint. Your health care provider puts a needle into the joint and injects medicine with an attached syringe. The injected medicine may relieve the pain, swelling, and stiffness  of arthritis. The injected medicine may also help to lubricate and cushion your knee joint. You may need more than one injection. LET [facility] CARE PROVIDER KNOW ABOUT:  Any allergies you have.  All medicines you are taking, including vitamins, herbs, eye drops, creams, and over-the-counter medicines.  Previous problems you or members of your family have had with the use of anesthetics.  Any blood disorders you have.  Previous surgeries you have had.  Any medical conditions you may have. RISKS AND COMPLICATIONS Generally, this is a safe procedure. However, problems may occur, including:  Infection.  Bleeding.  Worsening symptoms.  Damage to the area around your knee.  Allergic reaction to any of the medicines.  Skin reactions from repeated injections. BEFORE THE PROCEDURE  Ask your health care provider about changing or stopping your regular medicines. This is especially important if you are taking diabetes medicines or blood thinners.  Plan to have someone take you home after the procedure. PROCEDURE  You will sit or lie down in a position for your knee to be treated.  The skin over your kneecap will be cleaned with a germ-killing solution (antiseptic).  You will be given a medicine that numbs the area (local anesthetic). You may feel some stinging.  After your knee becomes numb, you will have a second injection. This is the medicine. This needle is carefully placed between your kneecap and your knee. The medicine is injected into the joint space.  At the end of the procedure, the needle will be removed.  A bandage (dressing) may be placed over the injection  site. The procedure may vary among health care providers and hospitals. AFTER THE PROCEDURE  You may have to move your knee through its full range of motion. This helps to get all of the medicine into your joint space.  Your blood pressure, heart rate, breathing rate, and blood oxygen level will be monitored often until the medicines you were given have worn off.  You will be watched to make sure that you do not have a reaction to the injected medicine.   This information is not intended to replace advice given to you by your health care provider. Make sure you discuss any questions you have with your health care provider.   Document Released: 08/23/2006 Document Revised: 06/22/2014 Document Reviewed: 04/11/2014 Elsevier Interactive Patient Education [employer].

## 2016-01-20 ENCOUNTER — Telehealth: Payer: Self-pay

## 2016-02-21 NOTE — Telephone Encounter (Signed)
Error, no message

## 2016-02-24 ENCOUNTER — Encounter: Payer: Medicare HMO | Admitting: Pain Medicine

## 2016-03-05 ENCOUNTER — Encounter: Payer: Self-pay | Admitting: Pain Medicine

## 2016-03-05 ENCOUNTER — Ambulatory Visit: Payer: Medicare HMO | Attending: Pain Medicine | Admitting: Pain Medicine

## 2016-03-05 VITALS — BP 105/68 | HR 73 | Temp 98.1°F | Resp 16 | Ht 66.0 in | Wt 160.0 lb

## 2016-03-05 DIAGNOSIS — F419 Anxiety disorder, unspecified: Secondary | ICD-10-CM | POA: Insufficient documentation

## 2016-03-05 DIAGNOSIS — M810 Age-related osteoporosis without current pathological fracture: Secondary | ICD-10-CM | POA: Insufficient documentation

## 2016-03-05 DIAGNOSIS — M542 Cervicalgia: Secondary | ICD-10-CM | POA: Insufficient documentation

## 2016-03-05 DIAGNOSIS — M47812 Spondylosis without myelopathy or radiculopathy, cervical region: Secondary | ICD-10-CM | POA: Insufficient documentation

## 2016-03-05 DIAGNOSIS — N529 Male erectile dysfunction, unspecified: Secondary | ICD-10-CM | POA: Insufficient documentation

## 2016-03-05 DIAGNOSIS — M25512 Pain in left shoulder: Secondary | ICD-10-CM | POA: Insufficient documentation

## 2016-03-05 DIAGNOSIS — R1031 Right lower quadrant pain: Secondary | ICD-10-CM | POA: Diagnosis not present

## 2016-03-05 DIAGNOSIS — G4700 Insomnia, unspecified: Secondary | ICD-10-CM | POA: Diagnosis not present

## 2016-03-05 DIAGNOSIS — K5903 Drug induced constipation: Secondary | ICD-10-CM | POA: Diagnosis not present

## 2016-03-05 DIAGNOSIS — Z87891 Personal history of nicotine dependence: Secondary | ICD-10-CM | POA: Insufficient documentation

## 2016-03-05 DIAGNOSIS — K5730 Diverticulosis of large intestine without perforation or abscess without bleeding: Secondary | ICD-10-CM | POA: Diagnosis not present

## 2016-03-05 DIAGNOSIS — C7800 Secondary malignant neoplasm of unspecified lung: Secondary | ICD-10-CM | POA: Insufficient documentation

## 2016-03-05 DIAGNOSIS — M25561 Pain in right knee: Secondary | ICD-10-CM | POA: Insufficient documentation

## 2016-03-05 DIAGNOSIS — F329 Major depressive disorder, single episode, unspecified: Secondary | ICD-10-CM | POA: Diagnosis not present

## 2016-03-05 DIAGNOSIS — M25562 Pain in left knee: Secondary | ICD-10-CM | POA: Diagnosis present

## 2016-03-05 DIAGNOSIS — G8929 Other chronic pain: Secondary | ICD-10-CM | POA: Diagnosis not present

## 2016-03-05 DIAGNOSIS — M792 Neuralgia and neuritis, unspecified: Secondary | ICD-10-CM

## 2016-03-05 DIAGNOSIS — C61 Malignant neoplasm of prostate: Secondary | ICD-10-CM | POA: Insufficient documentation

## 2016-03-05 DIAGNOSIS — Z79891 Long term (current) use of opiate analgesic: Secondary | ICD-10-CM | POA: Diagnosis not present

## 2016-03-05 DIAGNOSIS — E559 Vitamin D deficiency, unspecified: Secondary | ICD-10-CM | POA: Diagnosis not present

## 2016-03-05 DIAGNOSIS — F1190 Opioid use, unspecified, uncomplicated: Secondary | ICD-10-CM

## 2016-03-05 DIAGNOSIS — C227 Other specified carcinomas of liver: Secondary | ICD-10-CM | POA: Insufficient documentation

## 2016-03-05 DIAGNOSIS — B182 Chronic viral hepatitis C: Secondary | ICD-10-CM | POA: Diagnosis not present

## 2016-03-05 DIAGNOSIS — J449 Chronic obstructive pulmonary disease, unspecified: Secondary | ICD-10-CM | POA: Insufficient documentation

## 2016-03-05 DIAGNOSIS — M545 Low back pain: Secondary | ICD-10-CM | POA: Diagnosis present

## 2016-03-05 DIAGNOSIS — E039 Hypothyroidism, unspecified: Secondary | ICD-10-CM | POA: Insufficient documentation

## 2016-03-05 DIAGNOSIS — I10 Essential (primary) hypertension: Secondary | ICD-10-CM | POA: Insufficient documentation

## 2016-03-05 DIAGNOSIS — R1011 Right upper quadrant pain: Secondary | ICD-10-CM | POA: Insufficient documentation

## 2016-03-05 DIAGNOSIS — G893 Neoplasm related pain (acute) (chronic): Secondary | ICD-10-CM | POA: Diagnosis not present

## 2016-03-05 DIAGNOSIS — M47816 Spondylosis without myelopathy or radiculopathy, lumbar region: Secondary | ICD-10-CM | POA: Insufficient documentation

## 2016-03-05 DIAGNOSIS — K7460 Unspecified cirrhosis of liver: Secondary | ICD-10-CM | POA: Insufficient documentation

## 2016-03-05 DIAGNOSIS — M8580 Other specified disorders of bone density and structure, unspecified site: Secondary | ICD-10-CM | POA: Insufficient documentation

## 2016-03-05 DIAGNOSIS — F1020 Alcohol dependence, uncomplicated: Secondary | ICD-10-CM | POA: Insufficient documentation

## 2016-03-05 DIAGNOSIS — M4316 Spondylolisthesis, lumbar region: Secondary | ICD-10-CM | POA: Insufficient documentation

## 2016-03-05 MED ORDER — OXYCODONE HCL 15 MG PO TABS: 15.0000 mg | ORAL_TABLET | Freq: Four times a day (QID) | ORAL | 0 refills | Status: DC | PRN

## 2016-03-05 MED ORDER — GABAPENTIN 800 MG PO TABS: 800.0000 mg | ORAL_TABLET | Freq: Three times a day (TID) | ORAL | 2 refills | Status: DC

## 2016-03-05 NOTE — Patient Instructions (Signed)
Pain Management Discharge Instructions  General Discharge Instructions :  If you need to reach your doctor call: Monday-Friday 8:00 am - 4:00 pm at [phone] or toll free [phone].  After clinic hours [phone] to have operator reach doctor.  Bring all of your medication bottles to all your appointments in the pain clinic.  To cancel or reschedule your appointment with Pain Management please remember to call 24 hours in advance to avoid a fee.  Refer to the educational materials which you have been given on: General Risks, I had my Procedure. Discharge Instructions, Post Sedation.  Post Procedure Instructions:  The drugs you were given will stay in your system until tomorrow, so for the next 24 hours you should not drive, make any legal decisions or drink any alcoholic beverages.  You may eat anything you prefer, but it is better to start with liquids then soups and crackers, and gradually work up to solid foods.  Please notify your doctor immediately if you have any unusual bleeding, trouble breathing or pain that is not related to your normal pain.  Depending on the type of procedure that was done, some parts of your body may feel week and/or numb.  This usually clears up by tonight or the next day.  Walk with the use of an assistive device or accompanied by an adult for the 24 hours.  You may use ice on the affected area for the first 24 hours.  Put ice in a Ziploc bag and cover with a towel and place against area 15 minutes on 15 minutes off.  You may switch to heat after 24 hours.Knee Injection A knee injection is a procedure to get medicine into your knee joint. Your health care provider puts a needle into the joint and injects medicine with an attached syringe. The injected medicine may relieve the pain, swelling, and stiffness of arthritis. The injected medicine may also help to lubricate and cushion your knee joint. You may need more than one injection. LET [facility]  CARE PROVIDER KNOW ABOUT:  Any allergies you have.  All medicines you are taking, including vitamins, herbs, eye drops, creams, and over-the-counter medicines.  Previous problems you or members of your family have had with the use of anesthetics.  Any blood disorders you have.  Previous surgeries you have had.  Any medical conditions you may have. RISKS AND COMPLICATIONS Generally, this is a safe procedure. However, problems may occur, including:  Infection.  Bleeding.  Worsening symptoms.  Damage to the area around your knee.  Allergic reaction to any of the medicines.  Skin reactions from repeated injections. BEFORE THE PROCEDURE  Ask your health care provider about changing or stopping your regular medicines. This is especially important if you are taking diabetes medicines or blood thinners.  Plan to have someone take you home after the procedure. PROCEDURE  You will sit or lie down in a position for your knee to be treated.  The skin over your kneecap will be cleaned with a germ-killing solution (antiseptic).  You will be given a medicine that numbs the area (local anesthetic). You may feel some stinging.  After your knee becomes numb, you will have a second injection. This is the medicine. This needle is carefully placed between your kneecap and your knee. The medicine is injected into the joint space.  At the end of the procedure, the needle will be removed.  A bandage (dressing) may be placed over the injection site. The procedure may vary among  health care providers and hospitals. AFTER THE PROCEDURE  You may have to move your knee through its full range of motion. This helps to get all of the medicine into your joint space.  Your blood pressure, heart rate, breathing rate, and blood oxygen level will be monitored often until the medicines you were given have worn off.  You will be watched to make sure that you do not have a reaction to the injected  medicine.   This information is not intended to replace advice given to you by your health care provider. Make sure you discuss any questions you have with your health care provider.   Document Released: 08/23/2006 Document Revised: 06/22/2014 Document Reviewed: 04/11/2014 Elsevier Interactive Patient Education 2016 [place]  What are the risk, side effects and possible complications? Generally speaking, most procedures are safe.  However, with any procedure there are risks, side effects, and the possibility of complications.  The risks and complications are dependent upon the sites that are lesioned, or the type of nerve block to be performed.  The closer the procedure is to the spine, the more serious the risks are.  Great care is taken when placing the radio frequency needles, block needles or lesioning probes, but sometimes complications can occur. 1. Infection: Any time there is an injection through the skin, there is a risk of infection.  This is why sterile conditions are used for these blocks.  There are four possible types of infection. 1. Localized skin infection. 2. Central Nervous System Infection-This can be in the form of Meningitis, which can be deadly. 3. Epidural Infections-This can be in the form of an epidural abscess, which can cause pressure inside of the spine, causing compression of the spinal cord with subsequent paralysis. This would require an emergency surgery to decompress, and there are no guarantees that the patient would recover from the paralysis. 4. Discitis-This is an infection of the intervertebral discs.  It occurs in about 1% of discography procedures.  It is difficult to treat and it may lead to surgery.        2. Pain: the needles have to go through skin and soft tissues, will cause soreness.       3. Damage to internal structures:  The nerves to be lesioned may be near blood vessels or    other nerves which can be  potentially damaged.       4. Bleeding: Bleeding is more common if the patient is taking blood thinners such as  aspirin, Coumadin, Ticiid, Plavix, etc., or if he/she have some genetic predisposition  such as hemophilia. Bleeding into the spinal canal can cause compression of the spinal  cord with subsequent paralysis.  This would require an emergency surgery to  decompress and there are no guarantees that the patient would recover from the  paralysis.       5. Pneumothorax:  Puncturing of a lung is a possibility, every time a needle is introduced in  the area of the chest or upper back.  Pneumothorax refers to free air around the  collapsed lung(s), inside of the thoracic cavity (chest cavity).  Another two possible  complications related to a similar event would include: Hemothorax and Chylothorax.   These are variations of the Pneumothorax, where instead of air around the collapsed  lung(s), you may have blood or chyle, respectively.       6. Spinal headaches: They may occur with any procedures in the area of  the spine.       7. Persistent CSF (Cerebro-Spinal Fluid) leakage: This is a rare problem, but may occur  with prolonged intrathecal or epidural catheters either due to the formation of a fistulous  track or a dural tear.       8. Nerve damage: By working so close to the spinal cord, there is always a possibility of  nerve damage, which could be as serious as a permanent spinal cord injury with  paralysis.       9. Death:  Although rare, severe deadly allergic reactions known as "Anaphylactic  reaction" can occur to any of the medications used.      10. Worsening of the symptoms:  We can always make thing worse.  What are the chances of something like this happening? Chances of any of this occuring are extremely low.  By statistics, you have more of a chance of getting killed in a motor vehicle accident: while driving to the hospital than any of the above occurring .  Nevertheless, you should be  aware that they are possibilities.  In general, it is similar to taking a shower.  Everybody knows that you can slip, hit your head and get killed.  Does that mean that you should not shower again?  Nevertheless always keep in mind that statistics do not mean anything if you happen to be on the wrong side of them.  Even if a procedure has a 1 (one) in a 1,000,000 (million) chance of going wrong, it you happen to be that one..Also, keep in mind that by statistics, you have more of a chance of having something go wrong when taking medications.  Who should not have this procedure? If you are on a blood thinning medication (e.g. Coumadin, Plavix, see list of "Blood Thinners"), or if you have an active infection going on, you should not have the procedure.  If you are taking any blood thinners, please inform your physician.  How should I prepare for this procedure?  Do not eat or drink anything at least six hours prior to the procedure.  Bring a driver with you .  It cannot be a taxi.  Come accompanied by an adult that can drive you back, and that is strong enough to help you if your legs get weak or numb from the local anesthetic.  Take all of your medicines the morning of the procedure with just enough water to swallow them.  If you have diabetes, make sure that you are scheduled to have your procedure done first thing in the morning, whenever possible.  If you have diabetes, take only half of your insulin dose and notify our nurse that you have done so as soon as you arrive at the clinic.  If you are diabetic, but only take blood sugar pills (oral hypoglycemic), then do not take them on the morning of your procedure.  You may take them after you have had the procedure.  Do not take aspirin or any aspirin-containing medications, at least eleven (11) days prior to the procedure.  They may prolong bleeding.  Wear loose fitting clothing that may be easy to take off and that you would not mind if it  got stained with Betadine or blood.  Do not wear any jewelry or perfume  Remove any nail coloring.  It will interfere with some of our monitoring equipment.  NOTE: Remember that this is not meant to be interpreted as a complete list of all possible complications.  Unforeseen problems may occur.  BLOOD THINNERS The following drugs contain aspirin or other products, which can cause increased bleeding during surgery and should not be taken for 2 weeks prior to and 1 week after surgery.  If you should need take something for relief of minor pain, you may take acetaminophen which is found in Tylenol,m Datril, Anacin-3 and Panadol. It is not blood thinner. The products listed below are.  Do not take any of the products listed below in addition to any listed on your instruction sheet.  A.P.C or A.P.C with Codeine Codeine Phosphate Capsules #3 Ibuprofen Ridaura  ABC compound Congesprin Imuran rimadil  Advil Cope Indocin Robaxisal  Alka-Seltzer Effervescent Pain Reliever and Antacid Coricidin or Coricidin-D  Indomethacin Rufen  Alka-Seltzer plus Cold Medicine Cosprin Ketoprofen S-A-C Tablets  Anacin Analgesic Tablets or Capsules Coumadin Korlgesic Salflex  Anacin Extra Strength Analgesic tablets or capsules CP-2 Tablets Lanoril Salicylate  Anaprox Cuprimine Capsules Levenox Salocol  Anexsia-D Dalteparin Magan Salsalate  Anodynos Darvon compound Magnesium Salicylate Sine-off  Ansaid Dasin Capsules Magsal Sodium Salicylate  Anturane Depen Capsules Marnal Soma  APF Arthritis pain formula Dewitt's Pills Measurin Stanback  Argesic Dia-Gesic Meclofenamic Sulfinpyrazone  Arthritis Bayer Timed Release Aspirin Diclofenac Meclomen Sulindac  Arthritis pain formula Anacin Dicumarol Medipren Supac  Analgesic (Safety coated) Arthralgen Diffunasal Mefanamic Suprofen  Arthritis Strength Bufferin Dihydrocodeine Mepro Compound Suprol  Arthropan liquid Dopirydamole Methcarbomol with Aspirin Synalgos  ASA  tablets/Enseals Disalcid Micrainin Tagament  Ascriptin Doan's Midol Talwin  Ascriptin A/D Dolene Mobidin Tanderil  Ascriptin Extra Strength Dolobid Moblgesic Ticlid  Ascriptin with Codeine Doloprin or Doloprin with Codeine Momentum Tolectin  Asperbuf Duoprin Mono-gesic Trendar  Aspergum Duradyne Motrin or Motrin IB Triminicin  Aspirin plain, buffered or enteric coated Durasal Myochrisine Trigesic  Aspirin Suppositories Easprin Nalfon Trillsate  Aspirin with Codeine Ecotrin Regular or Extra Strength Naprosyn Uracel  Atromid-S Efficin Naproxen Ursinus  Auranofin Capsules Elmiron Neocylate Vanquish  Axotal Emagrin Norgesic Verin  Azathioprine Empirin or Empirin with Codeine Normiflo Vitamin E  Azolid Emprazil Nuprin Voltaren  Bayer Aspirin plain, buffered or children's or timed BC Tablets or powders Encaprin Orgaran Warfarin Sodium  Buff-a-Comp Enoxaparin Orudis Zorpin  Buff-a-Comp with Codeine Equegesic Os-Cal-Gesic   Buffaprin Excedrin plain, buffered or Extra Strength Oxalid   Bufferin Arthritis Strength Feldene Oxphenbutazone   Bufferin plain or Extra Strength Feldene Capsules Oxycodone with Aspirin   Bufferin with Codeine Fenoprofen Fenoprofen Pabalate or Pabalate-SF   Buffets II Flogesic Panagesic   Buffinol plain or Extra Strength Florinal or Florinal with Codeine Panwarfarin   Buf-Tabs Flurbiprofen Penicillamine   Butalbital Compound Four-way cold tablets Penicillin   Butazolidin Fragmin Pepto-Bismol   Carbenicillin Geminisyn Percodan   Carna Arthritis Reliever Geopen Persantine   Carprofen Gold's salt Persistin   Chloramphenicol Goody's Phenylbutazone   Chloromycetin Haltrain Piroxlcam   Clmetidine heparin Plaquenil   Cllnoril Hyco-pap Ponstel   Clofibrate Hydroxy chloroquine Propoxyphen         Before stopping any of these medications, be sure to consult the physician who ordered them.  Some, such as Coumadin (Warfarin) are ordered to prevent or treat serious conditions  such as "deep thrombosis", "pumonary embolisms", and other heart problems.  The amount of time that you may need off of the medication may also vary with the medication and the reason for which you were taking it.  If you are taking any of these medications, please make sure you notify your pain physician before you undergo any procedures.

## 2016-03-05 NOTE — Progress Notes (Signed)
Patient's Name: [facility].  MRN: [id number]  Referring Provider: Theotis Burrow*  DOB: 1945/08/04  PCP: Elyse Jarvis, MD  DOS: 03/05/2016  Note by: Kathlen Brunswick. Dossie Arbour, MD  Service setting: Ambulatory outpatient  Specialty: Interventional Pain Management  Location: [facility] (AMB) Pain Management Facility    Patient type: Established   Primary Reason(s) for Visit: Encounter for prescription drug management (Level of risk: moderate) CC: Knee Pain (left); Neck Pain (base of the neck); and Back Pain (lower)  HPI  Mr. Schroader is a 70 y.o. year old, male patient, who comes today for an initial evaluation. 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) ([place]); Chronic pain syndrome; Medical marijuana use; Chronic knee pain (Bilateral) (L>R); Long term prescription opiate use; Cancer related pain; Chronic low back pain (Location of Primary Source of Pain) (Bilateral) (R>L); Long term current use of opiate analgesic; Opiate use (90 MME/Day); Encounter for therapeutic drug level monitoring; Chronic neck pain; Encounter for chronic pain management; Chronic pain; Neurogenic pain; Opioid-induced constipation (OIC); Hypomagnesemia; Insomnia secondary to chronic pain; Chronic lower extremity pain (Location of Secondary source of pain) (Bilateral) (R>L); Chronic groin pain (Right); Osteoporosis, idiopathic; Vitamin D insufficiency; Low testosterone; Grade 1 Anterolisthesis (2 mm) of L5 over S1 (stable); Lumbar spondylosis; Lumbar facet syndrome (Location of Primary Source of Pain) (Bilateral) (R>L);  Cervical spondylosis; Chronic shoulder pain (Left); Chronic abdominal pain (RUQ); and Chronic hepatitis C without hepatic coma ([place]) on his problem list.. His primarily concern today is the Knee Pain (left); Neck Pain (base of the neck); and Back Pain (lower)  Pain Assessment: Self-Reported Pain Score: 4 /10 Clinically the patient looks like a 2/10 Reported level is inconsistent with clinical observations. Information on the proper use of the pain score provided to the patient today.    The patient comes into the clinics today for pharmacological management of his chronic pain. I last saw this patient on 11/20/2015. The patient  reports that he does not use drugs. His body mass index is 25.82 kg/m. Lung and liver cancer. Has history of Hepatitis C.  Date of Last Visit: 11/20/15 Service Provided on Last Visit: Med Refill  Controlled Substance Pharmacotherapy Assessment & REMS (Risk Evaluation and Mitigation Strategy)  Analgesic: Oxycodone IR 15 mg every 6 hours (60 mg/day) MME/day: 90 mg/day Pill Count: oxycodone 15 mg is 42/120 and filled on 02/12/2016. Pharmacokinetics: Onset of action (Liberation/Absorption): Within expected pharmacological parameters Time to Peak effect (Distribution): Timing and results are as within normal expected parameters Duration of action (Metabolism/Excretion): Within normal limits for medication Pharmacodynamics: Analgesic Effect: More than 50% Activity Facilitation: Medication(s) allow patient to sit, stand, walk, and do the basic ADLs Perceived Effectiveness: Described as relatively effective, allowing for increase in activities of daily living (ADL) Side-effects or Adverse reactions: None reported Monitoring: [place] PMP: Online review of the past 20-monthperiod conducted. Compliant with practice rules and regulations List of all UDS test(s) done:  Lab Results  Component Value Date   TOXASSSELUR FINAL 08/21/2015   [place] 06/19/2015   Last UDS on  record: ToxAssure Select 13  Date Value Ref Range Status  08/21/2015 FINAL  Final    Comment:    ==================================================================== TOXASSURE SELECT  13 (MW) ==================================================================== Specimen Alert Note:  Urinary creatinine is low; ability to detect some drugs may be compromised.  Interpret results with caution. ==================================================================== Test                             Result       Flag       Units Drug Present and Declared for Prescription Verification   Oxycodone                      8675         EXPECTED   ng/mg creat   Noroxycodone                   21244        EXPECTED   ng/mg creat    Sources of oxycodone include scheduled prescription medications.    Noroxycodone is an expected metabolite of oxycodone. ==================================================================== Test                      Result    Flag   Units      Ref Range   Creatinine              16        L      mg/dL      >=20 ==================================================================== Declared Medications:  The flagging and interpretation on this report are based on the  following declared medications.  Unexpected results may arise from  inaccuracies in the declared medications.  **Note: The testing scope of this panel includes these medications:  Oxycodone (Roxicodone)  **Note: The testing scope of this panel does not include following  reported medications:  Albuterol  Amoxicillin (Augmentin)  Azithromycin  Bisacodyl  Cholecalciferol  Clavulinate (Augmentin)  Diphenhydramine  Diphenhydramine (Benadryl)  Docusate  Docusate (Colace)  Fluoxetine (Prozac)  Furosemide (Lasix)  Gabapentin (Neurontin)  Hydrochlorothiazide  Iron (Ferrous Sulfate)  Levothyroxine  Lisinopril  Loratadine (Claritin-D)  Magnesium  Melatonin  Mometasone (Asmanex)  Multivitamin  Naproxen   Nystatin (Mycostatin)  Omeprazole  Oxybutynin  Pseudoephedrine (Claritin-D)  Sennosides  Supplement  Supplement (Krill Oil)  Tolterodine  Trospium (Sanctura)  Turmeric ==================================================================== For clinical consultation, please call [phone]. ====================================================================    UDS interpretation: Compliant          Medication Assessment Form: Reviewed. Patient indicates being compliant with therapy Treatment compliance: Compliant Risk Assessment: Aberrant Behavior: None observed today Substance Use Disorder (SUD) Risk Level: Low-to-moderate Risk of opioid abuse or dependence: 0.7-3.0% with doses ? 36 MME/day and 6.1-26% with doses ? 120 MME/day. Opioid Risk Tool (ORT) Score: 0 Low Risk for SUD (Score <3) Depression Scale Score: PHQ-2: 0 No depression (0) PHQ-9: 0 No depression (0-4)  Pharmacologic Plan: No change in therapy, at this time  Laboratory Chemistry  Inflammation Markers Lab Results  Component Value Date   ESRSEDRATE 45 (H) 08/21/2015   CRP 0.7 08/21/2015   Renal Function Lab Results  Component Value Date   BUN 12 08/21/2015   CREATININE 0.89 08/21/2015   GFRAA >60 08/21/2015   GFRNONAA >60 08/21/2015   Hepatic Function Lab Results  Component Value Date   AST 32 08/21/2015   ALT 19 08/21/2015   ALBUMIN 3.6 08/21/2015   Electrolytes Lab Results  Component Value Date   NA 136 08/21/2015   K 4.1 08/21/2015   CL 101 08/21/2015   CALCIUM 9.1 08/21/2015  MG 1.9 08/21/2015   Pain Modulating Vitamins Lab Results  Component Value Date   VD25OH 29.2 (L) 08/21/2015   VD125OH2TOT 45.5 08/21/2015   VITAMINB12 408 08/21/2015   Coagulation Parameters Lab Results  Component Value Date   PLT 292 03/29/2014   Cardiovascular Lab Results  Component Value Date   HGB 11.5 (L) 03/29/2014   HCT 36.9 (L) 03/29/2014   Note: Lab results reviewed.  Recent Diagnostic  Imaging  Dg Lumbar Spine Complete W/bend  Result Date: 08/21/2015 CLINICAL DATA:  Back pain.  No known injury. EXAM: LUMBAR SPINE - COMPLETE WITH BENDING VIEWS COMPARISON:  CT 12/26/2013. FINDINGS: Diffuse osteopenia degenerative change. No acute abnormality. Approximately 2 mm anterolisthesis L5 on S1 No flexion or extension deformity. Aortoiliac atherosclerotic vascular disease. IMPRESSION: 1. Diffuse osteopenia and degenerative change. Approximately 2 mm anterolisthesis L5-S1. No flexion or extension deformity. 2. Aortoiliac atherosclerotic vascular disease . Electronically Signed   By: Marcello Moores  Register   On: 08/21/2015 12:08    Meds  The patient has a current medication list which includes the following prescription(s): albuterol, vitamin d3, diphenhydramine, ferrous sulfate, fluoxetine, fluticasone, furosemide, gabapentin, levothyroxine, lisinopril, loratadine-pseudoephedrine, magnesium, mometasone, naloxone hcl, omeprazole, oxybutynin, oxycodone, oxycodone, oxycodone, senna-docusate, trospium, and turmeric.  Current Outpatient Prescriptions on File Prior to Visit  Medication Sig  . albuterol (PROVENTIL HFA;VENTOLIN HFA) 108 (90 BASE) MCG/ACT inhaler Inhale 2 puffs into the lungs every 6 (six) hours as needed for wheezing or shortness of breath.  . diphenhydrAMINE (BENADRYL) 25 mg capsule Take 50 mg by mouth at bedtime as needed. Reported on 08/21/2015  . ferrous sulfate 325 (65 FE) MG tablet Take 325 mg by mouth daily with breakfast.  . FLUoxetine (PROZAC) 20 MG capsule Take 40 mg by mouth daily. 40 mg qhs  . levothyroxine (SYNTHROID, LEVOTHROID) 25 MCG tablet Take 25 mcg by mouth daily before breakfast.  . lisinopril (PRINIVIL,ZESTRIL) 20 MG tablet Take 20 mg by mouth daily.  Marland Kitchen loratadine-pseudoephedrine (CLARITIN-D 24-HOUR) 10-240 MG 24 hr tablet Take 1 tablet by mouth as needed. Reported on 08/21/2015  . Magnesium 500 MG CAPS Take 500 mg by mouth daily.  . mometasone (ASMANEX) 220 MCG/INH  inhaler Inhale 2 puffs into the lungs daily.  . Naloxone HCl (NARCAN) 4 MG/0.1ML LIQD Place 1 spray into the nose once. Spray half of bottle content into each nostril, then call 911  . trospium (SANCTURA) 20 MG tablet Take 20 mg by mouth 2 (two) times daily.   . TURMERIC PO Take 900 mg by mouth daily.   No current facility-administered medications on file prior to visit.     ROS  Constitutional: Denies any fever or chills Gastrointestinal: No reported hemesis, hematochezia, vomiting, or acute GI distress Musculoskeletal: Denies any acute onset joint swelling, redness, loss of ROM, or weakness Neurological: No reported episodes of acute onset apraxia, aphasia, dysarthria, agnosia, amnesia, paralysis, loss of coordination, or loss of consciousness  Allergies  Mr. Hiegel is allergic to morphine and related and ciprofloxacin.  [place]  Medical:  Mr. Cacioppo  has a past medical history of Anemia; Anxiety; Arthritis; Cancer ([place]); Depression; and Swallowing difficulty. Family: family history includes Cancer in his mother; Diabetes in his father; Hypertension in his father; Lung disease in his father; Parkinson's disease in his father. Surgical:  has a past surgical history that includes Wrist fracture surgery; Head & neck wound repair / closure; Hemorrhoid surgery; Tonsillectomy; Lung removal, partial; CT RADIATION THERAPY GUIDE; and Tumor removal (06/03/2015). Tobacco:  reports that he quit  smoking about 9 years ago. His smoking use included Cigarettes. He has a 125.00 pack-year smoking history. He does not have any smokeless tobacco history on file. Alcohol:  reports that he does not drink alcohol. Drug:  reports that he does not use drugs.  Constitutional Exam  General appearance: Well nourished, well developed, and well hydrated. In no acute distress Vitals:   03/05/16 1322  BP: 105/68  Pulse: 73  Resp: 16  Temp: 98.1 F (36.7 C)  TempSrc: Oral  SpO2: 98%  Weight: 160 lb (72.6 kg)    Height: '5\' 6"'$  (1.676 m)  BMI Assessment: Estimated body mass index is 25.82 kg/m as calculated from the following:   Height as of this encounter: '5\' 6"'$  (1.676 m).   Weight as of this encounter: 160 lb (72.6 kg).   BMI interpretation: (25-29.9 kg/m2) = Overweight: This range is associated with a 20% higher incidence of chronic pain. BMI Readings from Last 4 Encounters:  03/05/16 25.82 kg/m  11/20/15 28.25 kg/m  08/29/15 29.05 kg/m  08/21/15 29.05 kg/m   Wt Readings from Last 4 Encounters:  03/05/16 160 lb (72.6 kg)  11/20/15 175 lb (79.4 kg)  08/29/15 180 lb (81.6 kg)  08/21/15 180 lb (81.6 kg)  Psych/Mental status: Alert and oriented x 3 (person, place, & time) Eyes: PERLA Respiratory: No evidence of acute respiratory distress  Cervical Spine Exam  Inspection: No masses, redness, or swelling Alignment: Symmetrical Functional ROM: ROM appears unrestricted Stability: No instability detected Muscle strength & Tone: Functionally intact Sensory: Unimpaired Palpation: Non-contributory  Upper Extremity (UE) Exam    Side: Right upper extremity  Side: Left upper extremity  Inspection: No masses, redness, swelling, or asymmetry  Inspection: No masses, redness, swelling, or asymmetry  Functional ROM: ROM appears unrestricted          Functional ROM: ROM appears unrestricted          Muscle strength & Tone: Functionally intact  Muscle strength & Tone: Functionally intact  Sensory: Unimpaired  Sensory: Unimpaired  Palpation: Non-contributory  Palpation: Non-contributory   Thoracic Spine Exam  Inspection: No masses, redness, or swelling Alignment: Symmetrical Functional ROM: ROM appears unrestricted Stability: No instability detected Sensory: Unimpaired Muscle strength & Tone: Functionally intact Palpation: Non-contributory  Lumbar Spine Exam  Inspection: No masses, redness, or swelling Alignment: Symmetrical Functional ROM: ROM appears unrestricted Stability: No  instability detected Muscle strength & Tone: Functionally intact Sensory: Unimpaired Palpation: Non-contributory Provocative Tests: Lumbar Hyperextension and rotation test: evaluation deferred today       Patrick's Maneuver: evaluation deferred today              Gait & Posture Assessment  Ambulation: Unassisted Gait: Relatively normal for age and body habitus Posture: WNL   Lower Extremity Exam    Side: Right lower extremity  Side: Left lower extremity  Inspection: No masses, redness, swelling, or asymmetry  Inspection: No masses, redness, swelling, or asymmetry  Functional ROM: ROM appears unrestricted          Functional ROM: ROM appears unrestricted          Muscle strength & Tone: Functionally intact  Muscle strength & Tone: Functionally intact  Sensory: Unimpaired  Sensory: Unimpaired  Palpation: Non-contributory  Palpation: Non-contributory   Assessment  Primary Diagnosis & Pertinent Problem List: The primary encounter diagnosis was Opiate use (90 MME/Day). Diagnoses of Chronic pain, Long term current use of opiate analgesic, Cancer related pain, Chronic knee pain (Bilateral) (L>R), Neuropathic pain, and Neurogenic pain  were also pertinent to this visit.  Visit Diagnosis: 1. Opiate use (90 MME/Day)   2. Chronic pain   3. Long term current use of opiate analgesic   4. Cancer related pain   5. Chronic knee pain (Bilateral) (L>R)   6. Neuropathic pain   7. Neurogenic pain    Plan of Care   Problem List Items Addressed This Visit      High   Cancer related pain   Chronic knee pain (Bilateral) (L>R) (Chronic)   Relevant Medications   oxyCODONE (ROXICODONE) 15 MG immediate release tablet (Start on 03/12/2016)   oxyCODONE (ROXICODONE) 15 MG immediate release tablet (Start on 04/11/2016)   oxyCODONE (ROXICODONE) 15 MG immediate release tablet (Start on 05/11/2016)   gabapentin (NEURONTIN) 800 MG tablet   Other Relevant Orders   KNEE INJECTION   Chronic pain (Chronic)    Relevant Medications   oxyCODONE (ROXICODONE) 15 MG immediate release tablet (Start on 03/12/2016)   oxyCODONE (ROXICODONE) 15 MG immediate release tablet (Start on 04/11/2016)   oxyCODONE (ROXICODONE) 15 MG immediate release tablet (Start on 05/11/2016)   gabapentin (NEURONTIN) 800 MG tablet   Neurogenic pain (Chronic)   Relevant Medications   gabapentin (NEURONTIN) 800 MG tablet     Medium   Long term current use of opiate analgesic (Chronic)   Opiate use (90 MME/Day) - Primary (Chronic)    Other Visit Diagnoses    Neuropathic pain  (Chronic)      Relevant Medications   gabapentin (NEURONTIN) 800 MG tablet     Pharmacotherapy (Medications Ordered): Meds ordered this encounter  Medications  . oxyCODONE (ROXICODONE) 15 MG immediate release tablet    Sig: Take 1 tablet (15 mg total) by mouth every 6 (six) hours as needed for pain.    Dispense:  120 tablet    Refill:  0    Do not place this medication, or any other prescription from our practice, on "Automatic Refill". Patient may have prescription filled one day early if pharmacy is closed on scheduled refill date. Do not fill until: 03/12/16 To last until: 04/11/16  . oxyCODONE (ROXICODONE) 15 MG immediate release tablet    Sig: Take 1 tablet (15 mg total) by mouth every 6 (six) hours as needed for pain.    Dispense:  120 tablet    Refill:  0    Do not place this medication, or any other prescription from our practice, on "Automatic Refill". Patient may have prescription filled one day early if pharmacy is closed on scheduled refill date. Do not fill until: 04/11/16 To last until: 05/11/16  . oxyCODONE (ROXICODONE) 15 MG immediate release tablet    Sig: Take 1 tablet (15 mg total) by mouth every 6 (six) hours as needed for pain.    Dispense:  120 tablet    Refill:  0    Do not place this medication, or any other prescription from our practice, on "Automatic Refill". Patient may have prescription filled one day early if pharmacy  is closed on scheduled refill date. Do not fill until: 05/11/16 To last until: 06/10/16  . gabapentin (NEURONTIN) 800 MG tablet    Sig: Take 1 tablet (800 mg total) by mouth 3 (three) times daily.    Dispense:  90 tablet    Refill:  2    Do not place this medication, or any other prescription from our practice, on "Automatic Refill". Patient may have prescription filled one day early if pharmacy is closed on scheduled refill  date.   New Prescriptions   No medications on file   Medications administered during this visit: Mr. Kann had no medications administered during this visit. Lab-work, Procedure(s), & Referral(s) Ordered: Orders Placed This Encounter  Procedures  . KNEE INJECTION   Imaging & Referral(s) Ordered: None  Interventional Therapies: Scheduled:  Diagnostic Left Knee injection   Considering:   1. Diagnostic bilateral cervical facet block under fluoroscopic guidance and IV sedation.  2. Possible cervical facet radiofrequency ablation  3. Diagnostic right-sided celiac plexus block under fluoroscopic guidance and IV sedation.  4. Possible celiac plexus neurolysis.  5. Diagnostic right-sided sacroiliac joint block under fluoroscopic guidance, with or without sedation.  6. Possible right sided sacroiliac joint radiofrequency ablation.  7. Diagnostic bilateral intra-articular knee injection, without fluoroscopy or IV sedation.  8. Possible series of intra-articular Hyalgan knee injections without fluoroscopy or IV sedation.  9. Diagnostic bilateral genicular nerve block under fluoroscopic guidance and IV sedation.  10. Possible bilateral genicular nerve radiofrequency ablation.  11. Diagnostic bilateral lumbar facet block under fluoroscopic guidance and IV sedation.  12. Possible bilateral lumbar facet radiofrequency ablation.  13. Diagnostic right-sided L4-5 lumbar epidural steroid injection under fluoroscopic guidance, with or without sedation.  14. Diagnostic left  sided cervical epidural steroid injection under fluoroscopic guidance, with or without sedation.  15. Diagnostic left sided intra-articular shoulder joint injection under fluoroscopic guidance, without sedation.  16. Diagnostic left sided suprascapular nerve block under fluoroscopic guidance, no sedation.  17. Possible left sided suprascapular nerve radiofrequency ablation under fluoroscopic guidance and IV sedation.    PRN Procedures:   1. Diagnostic bilateral cervical facet block under fluoroscopic guidance and IV sedation.  2. Diagnostic right-sided celiac plexus block under fluoroscopic guidance and IV sedation.  3. Diagnostic right-sided sacroiliac joint block under fluoroscopic guidance, with or without sedation.  4. Diagnostic bilateral intra-articular knee injection, without fluoroscopy or IV sedation.  5. Diagnostic bilateral genicular nerve block under fluoroscopic guidance and IV sedation.  6. Diagnostic bilateral lumbar facet block under fluoroscopic guidance and IV sedation.  7. Diagnostic right-sided L4-5 lumbar epidural steroid injection under fluoroscopic guidance, with or without sedation.  8. Diagnostic left sided cervical epidural steroid injection under fluoroscopic guidance, with or without sedation.  9. Diagnostic left sided intra-articular shoulder joint injection under fluoroscopic guidance, without sedation.  10. Diagnostic left sided suprascapular nerve block under fluoroscopic guidance, no sedation.    Requested PM Follow-up: Return in 3 months (on 05/25/2016) for Med-Mgmt, In addition, Schedule Procedure, (ASAA).  Future Appointments Date Time Provider [place]  05/18/2016 1:40 PM Milinda Pointer, MD [facility] None    Primary Care Physician: Elyse Jarvis, MD Location: [facility] Outpatient Pain Management Facility Note by: Kathlen Brunswick. Dossie Arbour, M.D, DABA, DABAPM, DABPM, DABIPP, FIPP  Pain Score Disclaimer: We use the NRS-11 scale. This is a  self-reported, subjective measurement of pain severity with only modest accuracy. It is used primarily to identify changes within a particular patient. It must be understood that outpatient pain scales are significantly less accurate that those used for research, where they can be applied under ideal controlled circumstances with minimal exposure to variables. In reality, the score is likely to be a combination of pain intensity and pain affect, where pain affect describes the degree of emotional arousal or changes in action readiness caused by the sensory experience of pain. Factors such as social and work situation, setting, emotional state, anxiety levels, expectation, and prior pain experience may influence pain perception and show large inter-individual differences  that may also be affected by time variables.  Patient instructions provided during this appointment: Patient Instructions   Pain Management Discharge Instructions  General Discharge Instructions :  If you need to reach your doctor call: Monday-Friday 8:00 am - 4:00 pm at [phone] or toll free [phone].  After clinic hours [phone] to have operator reach doctor.  Bring all of your medication bottles to all your appointments in the pain clinic.  To cancel or reschedule your appointment with Pain Management please remember to call 24 hours in advance to avoid a fee.  Refer to the educational materials which you have been given on: General Risks, I had my Procedure. Discharge Instructions, Post Sedation.  Post Procedure Instructions:  The drugs you were given will stay in your system until tomorrow, so for the next 24 hours you should not drive, make any legal decisions or drink any alcoholic beverages.  You may eat anything you prefer, but it is better to start with liquids then soups and crackers, and gradually work up to solid foods.  Please notify your doctor immediately if you have any unusual bleeding, trouble  breathing or pain that is not related to your normal pain.  Depending on the type of procedure that was done, some parts of your body may feel week and/or numb.  This usually clears up by tonight or the next day.  Walk with the use of an assistive device or accompanied by an adult for the 24 hours.  You may use ice on the affected area for the first 24 hours.  Put ice in a Ziploc bag and cover with a towel and place against area 15 minutes on 15 minutes off.  You may switch to heat after 24 hours.Knee Injection A knee injection is a procedure to get medicine into your knee joint. Your health care provider puts a needle into the joint and injects medicine with an attached syringe. The injected medicine may relieve the pain, swelling, and stiffness of arthritis. The injected medicine may also help to lubricate and cushion your knee joint. You may need more than one injection. [facility] Center CARE PROVIDER KNOW ABOUT:  Any allergies you have.  All medicines you are taking, including vitamins, herbs, eye drops, creams, and over-the-counter medicines.  Previous problems you or members of your family have had with the use of anesthetics.  Any blood disorders you have.  Previous surgeries you have had.  Any medical conditions you may have. RISKS AND COMPLICATIONS Generally, this is a safe procedure. However, problems may occur, including:  Infection.  Bleeding.  Worsening symptoms.  Damage to the area around your knee.  Allergic reaction to any of the medicines.  Skin reactions from repeated injections. BEFORE THE PROCEDURE  Ask your health care provider about changing or stopping your regular medicines. This is especially important if you are taking diabetes medicines or blood thinners.  Plan to have someone take you home after the procedure. PROCEDURE  You will sit or lie down in a position for your knee to be treated.  The skin over your kneecap will be cleaned with a  germ-killing solution (antiseptic).  You will be given a medicine that numbs the area (local anesthetic). You may feel some stinging.  After your knee becomes numb, you will have a second injection. This is the medicine. This needle is carefully placed between your kneecap and your knee. The medicine is injected into the joint space.  At the end of the procedure, the  needle will be removed.  A bandage (dressing) may be placed over the injection site. The procedure may vary among health care providers and hospitals. AFTER THE PROCEDURE  You may have to move your knee through its full range of motion. This helps to get all of the medicine into your joint space.  Your blood pressure, heart rate, breathing rate, and blood oxygen level will be monitored often until the medicines you were given have worn off.  You will be watched to make sure that you do not have a reaction to the injected medicine.   This information is not intended to replace advice given to you by your health care provider. Make sure you discuss any questions you have with your health care provider.   Document Released: 08/23/2006 Document Revised: 06/22/2014 Document Reviewed: 04/11/2014 Elsevier Interactive Patient Education 2016 [place]  What are the risk, side effects and possible complications? Generally speaking, most procedures are safe.  However, with any procedure there are risks, side effects, and the possibility of complications.  The risks and complications are dependent upon the sites that are lesioned, or the type of nerve block to be performed.  The closer the procedure is to the spine, the more serious the risks are.  Great care is taken when placing the radio frequency needles, block needles or lesioning probes, but sometimes complications can occur. 1. Infection: Any time there is an injection through the skin, there is a risk of infection.  This is why sterile  conditions are used for these blocks.  There are four possible types of infection. 1. Localized skin infection. 2. Central Nervous System Infection-This can be in the form of Meningitis, which can be deadly. 3. Epidural Infections-This can be in the form of an epidural abscess, which can cause pressure inside of the spine, causing compression of the spinal cord with subsequent paralysis. This would require an emergency surgery to decompress, and there are no guarantees that the patient would recover from the paralysis. 4. Discitis-This is an infection of the intervertebral discs.  It occurs in about 1% of discography procedures.  It is difficult to treat and it may lead to surgery.        2. Pain: the needles have to go through skin and soft tissues, will cause soreness.       3. Damage to internal structures:  The nerves to be lesioned may be near blood vessels or    other nerves which can be potentially damaged.       4. Bleeding: Bleeding is more common if the patient is taking blood thinners such as  aspirin, Coumadin, Ticiid, Plavix, etc., or if he/she have some genetic predisposition  such as hemophilia. Bleeding into the spinal canal can cause compression of the spinal  cord with subsequent paralysis.  This would require an emergency surgery to  decompress and there are no guarantees that the patient would recover from the  paralysis.       5. Pneumothorax:  Puncturing of a lung is a possibility, every time a needle is introduced in  the area of the chest or upper back.  Pneumothorax refers to free air around the  collapsed lung(s), inside of the thoracic cavity (chest cavity).  Another two possible  complications related to a similar event would include: Hemothorax and Chylothorax.   These are variations of the Pneumothorax, where instead of air around the collapsed  lung(s), you may have blood or chyle, respectively.  6. Spinal headaches: They may occur with any procedures in the area of the  spine.       7. Persistent CSF (Cerebro-Spinal Fluid) leakage: This is a rare problem, but may occur  with prolonged intrathecal or epidural catheters either due to the formation of a fistulous  track or a dural tear.       8. Nerve damage: By working so close to the spinal cord, there is always a possibility of  nerve damage, which could be as serious as a permanent spinal cord injury with  paralysis.       9. Death:  Although rare, severe deadly allergic reactions known as "Anaphylactic  reaction" can occur to any of the medications used.      10. Worsening of the symptoms:  We can always make thing worse.  What are the chances of something like this happening? Chances of any of this occuring are extremely low.  By statistics, you have more of a chance of getting killed in a motor vehicle accident: while driving to the hospital than any of the above occurring .  Nevertheless, you should be aware that they are possibilities.  In general, it is similar to taking a shower.  Everybody knows that you can slip, hit your head and get killed.  Does that mean that you should not shower again?  Nevertheless always keep in mind that statistics do not mean anything if you happen to be on the wrong side of them.  Even if a procedure has a 1 (one) in a 1,000,000 (million) chance of going wrong, it you happen to be that one..Also, keep in mind that by statistics, you have more of a chance of having something go wrong when taking medications.  Who should not have this procedure? If you are on a blood thinning medication (e.g. Coumadin, Plavix, see list of "Blood Thinners"), or if you have an active infection going on, you should not have the procedure.  If you are taking any blood thinners, please inform your physician.  How should I prepare for this procedure?  Do not eat or drink anything at least six hours prior to the procedure.  Bring a driver with you .  It cannot be a taxi.  Come accompanied by an adult  that can drive you back, and that is strong enough to help you if your legs get weak or numb from the local anesthetic.  Take all of your medicines the morning of the procedure with just enough water to swallow them.  If you have diabetes, make sure that you are scheduled to have your procedure done first thing in the morning, whenever possible.  If you have diabetes, take only half of your insulin dose and notify our nurse that you have done so as soon as you arrive at the clinic.  If you are diabetic, but only take blood sugar pills (oral hypoglycemic), then do not take them on the morning of your procedure.  You may take them after you have had the procedure.  Do not take aspirin or any aspirin-containing medications, at least eleven (11) days prior to the procedure.  They may prolong bleeding.  Wear loose fitting clothing that may be easy to take off and that you would not mind if it got stained with Betadine or blood.  Do not wear any jewelry or perfume  Remove any nail coloring.  It will interfere with some of our monitoring equipment.  NOTE: Remember that this is  not meant to be interpreted as a complete list of all possible complications.  Unforeseen problems may occur.  BLOOD THINNERS The following drugs contain aspirin or other products, which can cause increased bleeding during surgery and should not be taken for 2 weeks prior to and 1 week after surgery.  If you should need take something for relief of minor pain, you may take acetaminophen which is found in Tylenol,m Datril, Anacin-3 and Panadol. It is not blood thinner. The products listed below are.  Do not take any of the products listed below in addition to any listed on your instruction sheet.  A.P.C or A.P.C with Codeine Codeine Phosphate Capsules #3 Ibuprofen Ridaura  ABC compound Congesprin Imuran rimadil  Advil Cope Indocin Robaxisal  Alka-Seltzer Effervescent Pain Reliever and Antacid Coricidin or Coricidin-D   Indomethacin Rufen  Alka-Seltzer plus Cold Medicine Cosprin Ketoprofen S-A-C Tablets  Anacin Analgesic Tablets or Capsules Coumadin Korlgesic Salflex  Anacin Extra Strength Analgesic tablets or capsules CP-2 Tablets Lanoril Salicylate  Anaprox Cuprimine Capsules Levenox Salocol  Anexsia-D Dalteparin Magan Salsalate  Anodynos Darvon compound Magnesium Salicylate Sine-off  Ansaid Dasin Capsules Magsal Sodium Salicylate  Anturane Depen Capsules Marnal Soma  APF Arthritis pain formula Dewitt's Pills Measurin Stanback  Argesic Dia-Gesic Meclofenamic Sulfinpyrazone  Arthritis Bayer Timed Release Aspirin Diclofenac Meclomen Sulindac  Arthritis pain formula Anacin Dicumarol Medipren Supac  Analgesic (Safety coated) Arthralgen Diffunasal Mefanamic Suprofen  Arthritis Strength Bufferin Dihydrocodeine Mepro Compound Suprol  Arthropan liquid Dopirydamole Methcarbomol with Aspirin Synalgos  ASA tablets/Enseals Disalcid Micrainin Tagament  Ascriptin Doan's Midol Talwin  Ascriptin A/D Dolene Mobidin Tanderil  Ascriptin Extra Strength Dolobid Moblgesic Ticlid  Ascriptin with Codeine Doloprin or Doloprin with Codeine Momentum Tolectin  Asperbuf Duoprin Mono-gesic Trendar  Aspergum Duradyne Motrin or Motrin IB Triminicin  Aspirin plain, buffered or enteric coated Durasal Myochrisine Trigesic  Aspirin Suppositories Easprin Nalfon Trillsate  Aspirin with Codeine Ecotrin Regular or Extra Strength Naprosyn Uracel  Atromid-S Efficin Naproxen Ursinus  Auranofin Capsules Elmiron Neocylate Vanquish  Axotal Emagrin Norgesic Verin  Azathioprine Empirin or Empirin with Codeine Normiflo Vitamin E  Azolid Emprazil Nuprin Voltaren  Bayer Aspirin plain, buffered or children's or timed BC Tablets or powders Encaprin Orgaran Warfarin Sodium  Buff-a-Comp Enoxaparin Orudis Zorpin  Buff-a-Comp with Codeine Equegesic Os-Cal-Gesic   Buffaprin Excedrin plain, buffered or Extra Strength Oxalid   Bufferin Arthritis Strength  Feldene Oxphenbutazone   Bufferin plain or Extra Strength Feldene Capsules Oxycodone with Aspirin   Bufferin with Codeine Fenoprofen Fenoprofen Pabalate or Pabalate-SF   Buffets II Flogesic Panagesic   Buffinol plain or Extra Strength Florinal or Florinal with Codeine Panwarfarin   Buf-Tabs Flurbiprofen Penicillamine   Butalbital Compound Four-way cold tablets Penicillin   Butazolidin Fragmin Pepto-Bismol   Carbenicillin Geminisyn Percodan   Carna Arthritis Reliever Geopen Persantine   Carprofen Gold's salt Persistin   Chloramphenicol Goody's Phenylbutazone   Chloromycetin Haltrain Piroxlcam   Clmetidine heparin Plaquenil   Cllnoril Hyco-pap Ponstel   Clofibrate Hydroxy chloroquine Propoxyphen         Before stopping any of these medications, be sure to consult the physician who ordered them.  Some, such as Coumadin (Warfarin) are ordered to prevent or treat serious conditions such as "deep thrombosis", "pumonary embolisms", and other heart problems.  The amount of time that you may need off of the medication may also vary with the medication and the reason for which you were taking it.  If you are taking any of these medications, please make sure  you notify your pain physician before you undergo any procedures.

## 2016-03-05 NOTE — Progress Notes (Signed)
Safety precautions to be maintained throughout the outpatient stay will include: orient to surroundings, keep bed in low position, maintain call bell within reach at all times, provide assistance with transfer out of bed and ambulation. Pill count is oxycodone 15 mg is 42/120 and filled on 02/12/2016

## 2016-04-02 ENCOUNTER — Encounter: Payer: Self-pay | Admitting: Pain Medicine

## 2016-04-02 ENCOUNTER — Ambulatory Visit: Payer: Medicare HMO | Attending: Pain Medicine | Admitting: Pain Medicine

## 2016-04-02 DIAGNOSIS — G8929 Other chronic pain: Secondary | ICD-10-CM

## 2016-04-02 DIAGNOSIS — M25561 Pain in right knee: Secondary | ICD-10-CM

## 2016-04-02 DIAGNOSIS — M25562 Pain in left knee: Secondary | ICD-10-CM

## 2016-04-02 MED ORDER — LIDOCAINE HCL (PF) 1 % IJ SOLN: 10.0000 mL | Freq: Once | INTRAMUSCULAR | Status: DC

## 2016-04-02 MED ORDER — ROPIVACAINE HCL 2 MG/ML IJ SOLN
INTRAMUSCULAR | Status: AC
  Administered 2016-04-02: 9 mL
  Filled 2016-04-02: qty 10

## 2016-04-02 MED ORDER — METHYLPREDNISOLONE ACETATE 80 MG/ML IJ SUSP: 80.0000 mg | Freq: Once | INTRAMUSCULAR | Status: DC

## 2016-04-02 MED ORDER — LIDOCAINE HCL (CARDIAC) 20 MG/ML IV SOLN
INTRAVENOUS | Status: AC
  Filled 2016-04-02: qty 5

## 2016-04-02 MED ORDER — ROPIVACAINE HCL 2 MG/ML IJ SOLN: 9.0000 mL | Freq: Once | INTRAMUSCULAR | Status: DC

## 2016-04-02 MED ORDER — METHYLPREDNISOLONE ACETATE 80 MG/ML IJ SUSP
INTRAMUSCULAR | Status: AC
  Administered 2016-04-02: 80 mg
  Filled 2016-04-02: qty 1

## 2016-04-02 NOTE — Progress Notes (Signed)
Safety precautions to be maintained throughout the outpatient stay will include: orient to surroundings, keep bed in low position, maintain call bell within reach at all times, provide assistance with transfer out of bed and ambulation.  

## 2016-04-02 NOTE — Progress Notes (Signed)
Patient's Name: Tyrone Dominguez.  MRN: [id number]  Referring Provider: Milinda Pointer, MD  DOB: 1945/10/05  PCP: Elyse Jarvis, MD  DOS: 04/02/2016  Note by: Kathlen Brunswick. Dossie Arbour, MD  Service setting: Ambulatory outpatient  Location: [facility] (AMB) Pain Management Facility  Visit type: Procedure  Specialty: Interventional Pain Management  Patient type: Established   Primary Reason for Visit: Interventional Pain Management Treatment. CC: Knee Pain (left )  Procedure:  Anesthesia, Analgesia, Anxiolysis:  Type: Diagnostic Intra-Articular Local anesthetic and steroid Knee Injection Region: Medial infrapatellar Knee Region Level: Knee Joint Laterality: Left knee  Type: Local Anesthesia Local Anesthetic: Lidocaine 1% Route: Infiltration ([place]/IM) IV Access: Declined Sedation: Declined  Indication(s): Analgesia          Indications: 1. Chronic knee pain (Bilateral) (L>R)    Pain Score: Pre-procedure: 1 /10 Post-procedure: 0-No pain/10  Pre-Procedure Assessment:  Tyrone Dominguez is a 70 y.o. (year old), male patient, seen today for interventional treatment. 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 His primarily concern today is the Knee Pain (left ) The encounter diagnosis was Chronic knee pain (Bilateral) (L>R).  Pain Type: Chronic pain Pain Location: Knee Pain Orientation: Left Pain Descriptors / Indicators: Burning Pain Frequency: Constant  Date of Last Visit: 03/05/16 Service Provided on Last Visit: Med Refill  Coagulation Parameters Lab Results  Component Value Date   PLT 292 03/29/2014   Verification of the correct person, correct site (including marking of site), and correct procedure were performed and confirmed by the patient.  Consent: Before the procedure and under the influence of  no sedative(s), amnesic(s), or anxiolytics, the patient was informed of the treatment options, risks and possible complications. To fulfill our ethical and legal obligations, as recommended by the American Medical Association's Code of Ethics, I have informed the patient of my clinical impression; the nature and purpose of the treatment or procedure; the risks, benefits, and possible complications of the intervention; the alternatives, including doing nothing; the risk(s) and benefit(s) of the alternative treatment(s) or procedure(s); and the risk(s) and benefit(s) of doing nothing. The patient was provided information about the general risks and possible complications associated with the procedure. These may include, but are not limited to: failure to achieve desired goals, infection, bleeding, organ or nerve damage, allergic reactions, paralysis, and death. In addition, the patient was informed of those risks and complications associated to the procedure, such as failure to decrease pain; infection; bleeding; organ or nerve damage with subsequent damage to sensory, motor, and/or autonomic systems, resulting in permanent pain, numbness, and/or weakness of one or several areas of the body; allergic reactions; (i.e.: anaphylactic reaction); and/or death. Furthermore, the patient was informed of those risks and complications associated with the medications. These include, but are not limited to: allergic reactions (i.e.: anaphylactic or anaphylactoid reaction(s)); adrenal axis suppression; blood sugar elevation that in diabetics may result in ketoacidosis or comma; water retention that in patients with history of congestive heart failure may result in shortness of breath, pulmonary edema, and decompensation with resultant heart failure; weight gain; swelling or edema; medication-induced neural toxicity; particulate matter embolism and blood vessel occlusion with resultant organ, and/or nervous system infarction;  and/or aseptic necrosis of one or more joints. Finally, the patient was informed that Medicine is not an exact science; therefore, there is also the possibility of unforeseen or unpredictable  risks and/or possible complications that may result in a catastrophic outcome. The patient indicated having understood very clearly. We have given the patient no guarantees and we have made no promises. Enough time was given to the patient to ask questions, all of which were answered to the patient's satisfaction. Tyrone Dominguez has indicated that he wanted to continue with the procedure.  Consent Attestation: I, the ordering provider, attest that I have discussed with the patient the benefits, risks, side-effects, alternatives, likelihood of achieving goals, and potential problems during recovery for the procedure that I have provided informed consent.  Pre-Procedure Preparation:  Safety Precautions: Allergies reviewed. The patient was asked about blood thinners, or active infections, both of which were denied. The patient was asked to confirm the procedure and laterality, before marking the site, and again before commencing the procedure. Appropriate site, procedure, and patient were confirmed by following the Joint Commission's Universal Protocol (UP.01.01.01), in the form of a "Time Out". The patient was asked to participate by confirming the accuracy of the "Time Out" information. Patient was assessed for positional comfort and pressure points before starting the procedure. Allergies: He is allergic to morphine and related and ciprofloxacin.. Allergy Precautions: None required Infection Control Precautions: Sterile technique used. Standard Universal Precautions were taken as recommended by the Department of [facility] for Disease Control and Prevention (CDC). Standard pre-surgical skin prep was conducted. Respiratory hygiene and cough etiquette was practiced. Hand hygiene observed. Safe injection  practices and needle disposal techniques followed. SDV (single dose vial) medications used. Medications properly checked for expiration dates and contaminants. Personal protective equipment (PPE) used as per protocol. Monitoring:  As per clinic protocol. Vitals:   04/02/16 1247 04/02/16 1418  BP: 110/69 127/73  Pulse: 87 75  Resp: 16 14  Temp: 97.6 F (36.4 C) 97.4 F (36.3 C)  SpO2: 99% 98%  Weight: 160 lb (72.6 kg)   Height: '5\' 7"'$  (1.702 m)   Calculated BMI: Body mass index is 25.06 kg/m. Time-out: "Time-out" completed before starting procedure, as per protocol.  Description of Procedure Process:   Time-out: "Time-out" completed before starting procedure, as per protocol. Position: Sitting Target Area: Knee Joint Approach: Just above the Medial tibial plateau, lateral to the infrapatellar tendon. Area Prepped: Entire knee area, from the mid-thigh to the mid-shin. Prepping solution: ChloraPrep (2% chlorhexidine gluconate and 70% isopropyl alcohol) Safety Precautions: Aspiration looking for blood return was conducted prior to all injections. At no point did we inject any substances, as a needle was being advanced. No attempts were made at seeking any paresthesias. Safe injection practices and needle disposal techniques used. Medications properly checked for expiration dates. SDV (single dose vial) medications used. Description of the Procedure: Protocol guidelines were followed. The patient was placed in position over the fluoroscopy table. The target area was identified and the area prepped in the usual manner. Skin desensitized using vapocoolant spray. Skin & deeper tissues infiltrated with local anesthetic. Appropriate amount of time allowed to pass for local anesthetics to take effect. The procedure needles were then advanced to the target area. Proper needle placement secured. Negative aspiration confirmed. Solution injected in intermittent fashion, asking for systemic symptoms every  0.5cc of injectate. The needles were then removed and the area cleansed, making sure to leave some of the prepping solution back to take advantage of its long term bactericidal properties. EBL: None Materials & Medications Used:  Needle(s) Used: 22g - 1.5" Needle(s) Medications Administered today: Please see chart for details.  Imaging  Guidance:  Type of Imaging Technique: None used Indication(s): N/A Exposure Time: No patient exposure Contrast: None used. Fluoroscopic Guidance: N/A Ultrasound Guidance: N/A Interpretation: N/A Antibiotic Prophylaxis:  Indication(s): No indications identified. Type:  Antibiotics Given (last 72 hours)    None      Post-operative Assessment:  Complications: No immediate post-treatment complications observed by team, or reported by patient. Disposition: The patient tolerated the entire procedure well. A repeat set of vitals were taken after the procedure and the patient was kept under observation following institutional policy, for this type of procedure. Post-procedural neurological assessment was performed, showing return to baseline, prior to discharge. The patient was provided with post-procedure discharge instructions, including a section on how to identify potential problems. Should any problems arise concerning this procedure, the patient was given instructions to immediately contact us, at any time, without hesitation. In any case, we plan to contact the patient by telephone for a follow-up status report regarding this interventional procedure. Comments:  No additional relevant information.  Plan of Care  Discharge to: Discharge home  Medications ordered for procedure: Meds ordered this encounter  Medications  . methylPREDNISolone acetate (DEPO-MEDROL) injection 80 mg  . lidocaine (PF) (XYLOCAINE) 1 % injection 10 mL  . ropivacaine (PF) 2 mg/ml (0.2%) (NAROPIN) epidural 9 mL  . ropivacaine (PF) 2 mg/ml (0.2%) (NAROPIN) 2 MG/ML epidural     GARNER, CYNTHIA: cabinet override  . DISCONTD: lidocaine (cardiac) 100 mg/41m (XYLOCAINE) 20 MG/ML injection 2%    GARNER, CYNTHIA: cabinet override  . methylPREDNISolone acetate (DEPO-MEDROL) 80 MG/ML injection    GARNER, CYNTHIA: cabinet override   Medications administered: (For more details, see medical record) We administered ropivacaine (PF) 2 mg/ml (0.2%) and methylPREDNISolone acetate.  Imaging Ordered: No results found for this or any previous visit. New Prescriptions   No medications on file   Physician-requested Follow-up:  Return in about 2 weeks (around 04/16/2016) for Post-Procedure evaluation, Keep prior appointment.  Future Appointments Date Time Provider [place] 04/29/2016 1:15 PM FMilinda Pointer MD [facility] None  05/21/2016 1:30 [facility]   Primary Care Physician: AElyse Jarvis MD [facility] Note by: FKathlen Brunswick NDossie Arbour M.D, DABA, DABAPM, DABPM, DABIPP, FIPP  Disclaimer:  Medicine is not an exact science. The only guarantee in medicine is that nothing is guaranteed. It is important to note that the decision to proceed with this intervention was based on the information collected from the patient. The Data and conclusions were drawn from the patient's questionnaire, the interview, and the physical examination. Because the information was provided in large part by the patient, it cannot be guaranteed that it has not been purposely or unconsciously manipulated. Every effort has been made to obtain as much relevant data as possible for this evaluation. It is important to note that the conclusions that lead to this procedure are derived in large part from the available data. Always take into account that the treatment will also be dependent on availability of resources and existing treatment guidelines, considered by other Pain Management Practitioners as being common knowledge and practice,  at the time of the intervention. For Medico-Legal purposes, it is also important to point out that variation in procedural techniques and pharmacological choices are the acceptable norm. The indications, contraindications, technique, and results of the above procedure should only be interpreted and judged by a Board-Certified Interventional Pain Specialist with extensive familiarity and expertise in the same exact procedure and technique. Attempts at providing  opinions without similar or greater experience and expertise than that of the treating physician will be considered as inappropriate and unethical, and shall result in a formal complaint to the state medical board and applicable specialty societies.  Instructions provided at this appointment: There are no Patient Instructions on file for this visit.

## 2016-04-03 ENCOUNTER — Telehealth: Payer: Self-pay | Admitting: *Deleted

## 2016-04-03 NOTE — Telephone Encounter (Signed)
Voicemail left with patient that if he has questions or concerns re; knee injection that he may call our office.

## 2016-04-15 ENCOUNTER — Telehealth: Payer: Self-pay | Admitting: Pain Medicine

## 2016-04-15 NOTE — Telephone Encounter (Signed)
Patient advised to see PCP, it is not a violation of the Pain Management contract if PCP prescribes narcotic meds for acute pain. Patient advised to let us know if additional meds are prescribed.

## 2016-04-15 NOTE — Telephone Encounter (Signed)
[address] last week and hurt ribs on right side, maybe broke it. Pain is increasing, maybe going to primary care, wants to increase meds. Please call patient

## 2016-04-29 ENCOUNTER — Ambulatory Visit: Payer: Medicare HMO | Attending: Pain Medicine | Admitting: Pain Medicine

## 2016-04-29 ENCOUNTER — Encounter: Payer: Self-pay | Admitting: Pain Medicine

## 2016-04-29 VITALS — BP 139/78 | HR 71 | Resp 16 | Ht 67.0 in | Wt 160.0 lb

## 2016-04-29 DIAGNOSIS — M542 Cervicalgia: Secondary | ICD-10-CM | POA: Insufficient documentation

## 2016-04-29 DIAGNOSIS — D649 Anemia, unspecified: Secondary | ICD-10-CM | POA: Diagnosis not present

## 2016-04-29 DIAGNOSIS — Z79891 Long term (current) use of opiate analgesic: Secondary | ICD-10-CM

## 2016-04-29 DIAGNOSIS — C228 Malignant neoplasm of liver, primary, unspecified as to type: Secondary | ICD-10-CM | POA: Diagnosis not present

## 2016-04-29 DIAGNOSIS — M792 Neuralgia and neuritis, unspecified: Secondary | ICD-10-CM

## 2016-04-29 DIAGNOSIS — Z9889 Other specified postprocedural states: Secondary | ICD-10-CM | POA: Insufficient documentation

## 2016-04-29 DIAGNOSIS — F419 Anxiety disorder, unspecified: Secondary | ICD-10-CM | POA: Insufficient documentation

## 2016-04-29 DIAGNOSIS — T402X5A Adverse effect of other opioids, initial encounter: Secondary | ICD-10-CM | POA: Diagnosis not present

## 2016-04-29 DIAGNOSIS — G893 Neoplasm related pain (acute) (chronic): Secondary | ICD-10-CM | POA: Diagnosis not present

## 2016-04-29 DIAGNOSIS — B182 Chronic viral hepatitis C: Secondary | ICD-10-CM | POA: Diagnosis not present

## 2016-04-29 DIAGNOSIS — M25561 Pain in right knee: Secondary | ICD-10-CM

## 2016-04-29 DIAGNOSIS — G894 Chronic pain syndrome: Secondary | ICD-10-CM | POA: Diagnosis not present

## 2016-04-29 DIAGNOSIS — M8580 Other specified disorders of bone density and structure, unspecified site: Secondary | ICD-10-CM | POA: Diagnosis not present

## 2016-04-29 DIAGNOSIS — F329 Major depressive disorder, single episode, unspecified: Secondary | ICD-10-CM | POA: Insufficient documentation

## 2016-04-29 DIAGNOSIS — G8929 Other chronic pain: Secondary | ICD-10-CM

## 2016-04-29 DIAGNOSIS — F1190 Opioid use, unspecified, uncomplicated: Secondary | ICD-10-CM | POA: Diagnosis not present

## 2016-04-29 DIAGNOSIS — E559 Vitamin D deficiency, unspecified: Secondary | ICD-10-CM | POA: Diagnosis not present

## 2016-04-29 DIAGNOSIS — M25562 Pain in left knee: Secondary | ICD-10-CM | POA: Insufficient documentation

## 2016-04-29 DIAGNOSIS — Z79899 Other long term (current) drug therapy: Secondary | ICD-10-CM | POA: Diagnosis not present

## 2016-04-29 DIAGNOSIS — M25512 Pain in left shoulder: Secondary | ICD-10-CM | POA: Insufficient documentation

## 2016-04-29 DIAGNOSIS — F17210 Nicotine dependence, cigarettes, uncomplicated: Secondary | ICD-10-CM | POA: Diagnosis not present

## 2016-04-29 DIAGNOSIS — M1712 Unilateral primary osteoarthritis, left knee: Secondary | ICD-10-CM

## 2016-04-29 MED ORDER — OXYCODONE HCL 15 MG PO TABS
15.0000 mg | ORAL_TABLET | Freq: Four times a day (QID) | ORAL | 0 refills | Status: AC | PRN
Start: 2016-07-10 — End: 2016-08-09

## 2016-04-29 MED ORDER — GABAPENTIN 800 MG PO TABS: 800.0000 mg | ORAL_TABLET | Freq: Three times a day (TID) | ORAL | 2 refills | Status: AC

## 2016-04-29 MED ORDER — OXYCODONE HCL 15 MG PO TABS: 15.0000 mg | ORAL_TABLET | Freq: Four times a day (QID) | ORAL | 0 refills | Status: AC | PRN

## 2016-04-29 NOTE — Patient Instructions (Signed)
GENERAL RISKS AND COMPLICATIONS  What are the risk, side effects and possible complications? Generally speaking, most procedures are safe.  However, with any procedure there are risks, side effects, and the possibility of complications.  The risks and complications are dependent upon the sites that are lesioned, or the type of nerve block to be performed.  The closer the procedure is to the spine, the more serious the risks are.  Great care is taken when placing the radio frequency needles, block needles or lesioning probes, but sometimes complications can occur. 1. Infection: Any time there is an injection through the skin, there is a risk of infection.  This is why sterile conditions are used for these blocks.  There are four possible types of infection. 1. Localized skin infection. 2. Central Nervous System Infection-This can be in the form of Meningitis, which can be deadly. 3. Epidural Infections-This can be in the form of an epidural abscess, which can cause pressure inside of the spine, causing compression of the spinal cord with subsequent paralysis. This would require an emergency surgery to decompress, and there are no guarantees that the patient would recover from the paralysis. 4. Discitis-This is an infection of the intervertebral discs.  It occurs in about 1% of discography procedures.  It is difficult to treat and it may lead to surgery.        2. Pain: the needles have to go through skin and soft tissues, will cause soreness.       3. Damage to internal structures:  The nerves to be lesioned may be near blood vessels or    other nerves which can be potentially damaged.       4. Bleeding: Bleeding is more common if the patient is taking blood thinners such as  aspirin, Coumadin, Ticiid, Plavix, etc., or if he/she have some genetic predisposition  such as hemophilia. Bleeding into the spinal canal can cause compression of the spinal  cord with subsequent paralysis.  This would require an  emergency surgery to  decompress and there are no guarantees that the patient would recover from the  paralysis.       5. Pneumothorax:  Puncturing of a lung is a possibility, every time a needle is introduced in  the area of the chest or upper back.  Pneumothorax refers to free air around the  collapsed lung(s), inside of the thoracic cavity (chest cavity).  Another two possible  complications related to a similar event would include: Hemothorax and Chylothorax.   These are variations of the Pneumothorax, where instead of air around the collapsed  lung(s), you may have blood or chyle, respectively.       6. Spinal headaches: They may occur with any procedures in the area of the spine.       7. Persistent CSF (Cerebro-Spinal Fluid) leakage: This is a rare problem, but may occur  with prolonged intrathecal or epidural catheters either due to the formation of a fistulous  track or a dural tear.       8. Nerve damage: By working so close to the spinal cord, there is always a possibility of  nerve damage, which could be as serious as a permanent spinal cord injury with  paralysis.       9. Death:  Although rare, severe deadly allergic reactions known as "Anaphylactic  reaction" can occur to any of the medications used.      10. Worsening of the symptoms:  We can always make thing worse.    What are the chances of something like this happening? Chances of any of this occuring are extremely low.  By statistics, you have more of a chance of getting killed in a motor vehicle accident: while driving to the hospital than any of the above occurring .  Nevertheless, you should be aware that they are possibilities.  In general, it is similar to taking a shower.  Everybody knows that you can slip, hit your head and get killed.  Does that mean that you should not shower again?  Nevertheless always keep in mind that statistics do not mean anything if you happen to be on the wrong side of them.  Even if a procedure has a 1  (one) in a 1,000,000 (million) chance of going wrong, it you happen to be that one..Also, keep in mind that by statistics, you have more of a chance of having something go wrong when taking medications.  Who should not have this procedure? If you are on a blood thinning medication (e.g. Coumadin, Plavix, see list of "Blood Thinners"), or if you have an active infection going on, you should not have the procedure.  If you are taking any blood thinners, please inform your physician.  How should I prepare for this procedure?  Do not eat or drink anything at least six hours prior to the procedure.  Bring a driver with you .  It cannot be a taxi.  Come accompanied by an adult that can drive you back, and that is strong enough to help you if your legs get weak or numb from the local anesthetic.  Take all of your medicines the morning of the procedure with just enough water to swallow them.  If you have diabetes, make sure that you are scheduled to have your procedure done first thing in the morning, whenever possible.  If you have diabetes, take only half of your insulin dose and notify our nurse that you have done so as soon as you arrive at the clinic.  If you are diabetic, but only take blood sugar pills (oral hypoglycemic), then do not take them on the morning of your procedure.  You may take them after you have had the procedure.  Do not take aspirin or any aspirin-containing medications, at least eleven (11) days prior to the procedure.  They may prolong bleeding.  Wear loose fitting clothing that may be easy to take off and that you would not mind if it got stained with Betadine or blood.  Do not wear any jewelry or perfume  Remove any nail coloring.  It will interfere with some of our monitoring equipment.  NOTE: Remember that this is not meant to be interpreted as a complete list of all possible complications.  Unforeseen problems may occur.  BLOOD THINNERS The following drugs  contain aspirin or other products, which can cause increased bleeding during surgery and should not be taken for 2 weeks prior to and 1 week after surgery.  If you should need take something for relief of minor pain, you may take acetaminophen which is found in Tylenol,m Datril, Anacin-3 and Panadol. It is not blood thinner. The products listed below are.  Do not take any of the products listed below in addition to any listed on your instruction sheet.  A.P.C or A.P.C with Codeine Codeine Phosphate Capsules #3 Ibuprofen Ridaura  ABC compound Congesprin Imuran rimadil  Advil Cope Indocin Robaxisal  Alka-Seltzer Effervescent Pain Reliever and Antacid Coricidin or Coricidin-D  Indomethacin Rufen    Alka-Seltzer plus Cold Medicine Cosprin Ketoprofen S-A-C Tablets  Anacin Analgesic Tablets or Capsules Coumadin Korlgesic Salflex  Anacin Extra Strength Analgesic tablets or capsules CP-2 Tablets Lanoril Salicylate  Anaprox Cuprimine Capsules Levenox Salocol  Anexsia-D Dalteparin Magan Salsalate  Anodynos Darvon compound Magnesium Salicylate Sine-off  Ansaid Dasin Capsules Magsal Sodium Salicylate  Anturane Depen Capsules Marnal Soma  APF Arthritis pain formula Dewitt's Pills Measurin Stanback  Argesic Dia-Gesic Meclofenamic Sulfinpyrazone  Arthritis Bayer Timed Release Aspirin Diclofenac Meclomen Sulindac  Arthritis pain formula Anacin Dicumarol Medipren Supac  Analgesic (Safety coated) Arthralgen Diffunasal Mefanamic Suprofen  Arthritis Strength Bufferin Dihydrocodeine Mepro Compound Suprol  Arthropan liquid Dopirydamole Methcarbomol with Aspirin Synalgos  ASA tablets/Enseals Disalcid Micrainin Tagament  Ascriptin Doan's Midol Talwin  Ascriptin A/D Dolene Mobidin Tanderil  Ascriptin Extra Strength Dolobid Moblgesic Ticlid  Ascriptin with Codeine Doloprin or Doloprin with Codeine Momentum Tolectin  Asperbuf Duoprin Mono-gesic Trendar  Aspergum Duradyne Motrin or Motrin IB Triminicin  Aspirin  plain, buffered or enteric coated Durasal Myochrisine Trigesic  Aspirin Suppositories Easprin Nalfon Trillsate  Aspirin with Codeine Ecotrin Regular or Extra Strength Naprosyn Uracel  Atromid-S Efficin Naproxen Ursinus  Auranofin Capsules Elmiron Neocylate Vanquish  Axotal Emagrin Norgesic Verin  Azathioprine Empirin or Empirin with Codeine Normiflo Vitamin E  Azolid Emprazil Nuprin Voltaren  Bayer Aspirin plain, buffered or children's or timed BC Tablets or powders Encaprin Orgaran Warfarin Sodium  Buff-a-Comp Enoxaparin Orudis Zorpin  Buff-a-Comp with Codeine Equegesic Os-Cal-Gesic   Buffaprin Excedrin plain, buffered or Extra Strength Oxalid   Bufferin Arthritis Strength Feldene Oxphenbutazone   Bufferin plain or Extra Strength Feldene Capsules Oxycodone with Aspirin   Bufferin with Codeine Fenoprofen Fenoprofen Pabalate or Pabalate-SF   Buffets II Flogesic Panagesic   Buffinol plain or Extra Strength Florinal or Florinal with Codeine Panwarfarin   Buf-Tabs Flurbiprofen Penicillamine   Butalbital Compound Four-way cold tablets Penicillin   Butazolidin Fragmin Pepto-Bismol   Carbenicillin Geminisyn Percodan   Carna Arthritis Reliever Geopen Persantine   Carprofen Gold's salt Persistin   Chloramphenicol Goody's Phenylbutazone   Chloromycetin Haltrain Piroxlcam   Clmetidine heparin Plaquenil   Cllnoril Hyco-pap Ponstel   Clofibrate Hydroxy chloroquine Propoxyphen         Before stopping any of these medications, be sure to consult the physician who ordered them.  Some, such as Coumadin (Warfarin) are ordered to prevent or treat serious conditions such as "deep thrombosis", "pumonary embolisms", and other heart problems.  The amount of time that you may need off of the medication may also vary with the medication and the reason for which you were taking it.  If you are taking any of these medications, please make sure you notify your pain physician before you undergo any  procedures.         Knee Injection A knee injection is a procedure to get medicine into your knee joint. Your health care provider puts a needle into the joint and injects medicine with an attached syringe. The injected medicine may relieve the pain, swelling, and stiffness of arthritis. The injected medicine may also help to lubricate and cushion your knee joint. You may need more than one injection. Tell a health care provider about:  Any allergies you have.  All medicines you are taking, including vitamins, herbs, eye drops, creams, and over-the-counter medicines.  Any problems you or family members have had with anesthetic medicines.  Any blood disorders you have.  Any surgeries you have had.  Any medical conditions you have. What are the risks?  Generally, this is a safe procedure. However, problems may occur, including:  Infection.  Bleeding.  Worsening symptoms.  Damage to the area around your knee.  Allergic reaction to any of the medicines.  Skin reactions from repeated injections. What happens before the procedure?  Ask your health care provider about changing or stopping your regular medicines. This is especially important if you are taking diabetes medicines or blood thinners.  Plan to have someone take you home after the procedure. What happens during the procedure?  You will sit or lie down in a position for your knee to be treated.  The skin over your kneecap will be cleaned with a germ-killing solution (antiseptic).  You will be given a medicine that numbs the area (local anesthetic). You may feel some stinging.  After your knee becomes numb, you will have a second injection. This is the medicine. This needle is carefully placed between your kneecap and your knee. The medicine is injected into the joint space.  At the end of the procedure, the needle will be removed.  A bandage (dressing) may be placed over the injection site. The procedure may  vary among health care providers and hospitals. What happens after the procedure?  You may have to move your knee through its full range of motion. This helps to get all of the medicine into your joint space.  Your blood pressure, heart rate, breathing rate, and blood oxygen level will be monitored often until the medicines you were given have worn off.  You will be watched to make sure that you do not have a reaction to the injected medicine. This information is not intended to replace advice given to you by your health care provider. Make sure you discuss any questions you have with your health care provider. Document Released: 08/23/2006 Document Revised: 11/01/2015 Document Reviewed: 04/11/2014 Elsevier Interactive Patient Education  2017 [employer].

## 2016-04-29 NOTE — Progress Notes (Signed)
Nursing Pain Medication Assessment:  Safety precautions to be maintained throughout the outpatient stay will include: orient to surroundings, keep bed in low position, maintain call bell within reach at all times, provide assistance with transfer out of bed and ambulation.  Medication Inspection Compliance: Pill count conducted under aseptic conditions, in front of the patient. Neither the pills nor the bottle was removed from the patient's sight at any time. Once count was completed pills were immediately returned to the patient in their original bottle.  Medication: See above Pill Count: 51 of 120 pills remain Bottle Appearance: Standard pharmacy container. Clearly labeled. Filled Date: 62 / 27 / 2017 Medication last intake:04/29/16

## 2016-04-29 NOTE — Progress Notes (Signed)
Patient's Name: [facility].  MRN: [id number]  Referring Provider: Theotis Burrow*  DOB: 09-27-1945  PCP: Theotis Burrow, MD  DOS: 04/29/2016  Note by: Kathlen Brunswick. Dossie Arbour, MD  Service setting: Ambulatory outpatient  Specialty: Interventional Pain Management  Location: [facility] (AMB) Pain Management Facility    Patient type: Established   Primary Reason(s) for Visit: Encounter for prescription drug management & post-procedure evaluation of chronic illness with mild to moderate exacerbation(Level of risk: moderate) CC: Neck Pain (left); Joint Pain (left thumb); and Knee Pain (left)  HPI  Mr. Elpers is a 70 y.o. year old, male patient, who comes today for a post-procedure evaluation and medication management. 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) ([place]); Chronic pain syndrome; Medical marijuana use; Chronic knee pain (Bilateral) (L>R); Long term prescription opiate use; Cancer related pain; Chronic low back pain (Location of Primary Source of Pain) (Bilateral) (R>L); Long term current use of opiate analgesic; Opiate use (90 MME/Day); Encounter for therapeutic drug level monitoring; Chronic neck pain; Encounter for chronic pain management; Neurogenic pain; Opioid-induced constipation (OIC); Hypomagnesemia; Insomnia secondary to chronic pain; Chronic lower extremity pain (Location of Secondary source of pain) (Bilateral) (R>L); Chronic groin pain (Right); Osteoporosis, idiopathic; Vitamin D insufficiency; Low testosterone; Grade 1 Anterolisthesis (2 mm) of L5 over S1 (stable);  Lumbar spondylosis; Lumbar facet syndrome (Location of Primary Source of Pain) (Bilateral) (R>L); Cervical spondylosis; Chronic shoulder pain (Left); Chronic abdominal pain (RUQ); Chronic hepatitis C without hepatic coma (HCC); and Osteoarthritis of knee (Left) on his problem list. His primarily concern today is the Neck Pain (left); Joint Pain (left thumb); and Knee Pain (left)  Pain Assessment: Self-Reported Pain Score: 2 /10             Reported level is compatible with observation.       Pain Type: Chronic pain Pain Location: Neck (joint pain, knee pain) Pain Orientation: Left (left thumb and left knee) Pain Descriptors / Indicators: Dull, Throbbing Pain Frequency: Constant  Mr. Weiland was last seen on 04/15/2016 for a procedure. During today's appointment we reviewed Mr. Klich's post-procedure results, as well as his outpatient medication regimen.  Further details on both, my assessment(s), as well as the proposed treatment plan, please see below.  Controlled Substance Pharmacotherapy Assessment REMS (Risk Evaluation and Mitigation Strategy)  Analgesic:Oxycodone IR 15 mg every 6 hours (60 mg/day) MME/day:90 mg/day Patterson,Delores G, RN  04/29/2016  1:18 PM  Sign at close encounter Nursing Pain Medication Assessment:  Safety precautions to be maintained throughout the outpatient stay will include: orient to surroundings, keep bed in low position, maintain call bell within reach at all times, provide assistance with transfer out of bed and ambulation.  Medication Inspection Compliance: Pill count conducted under aseptic conditions, in front of the patient. Neither the pills nor the bottle was removed from the patient's sight at any time. Once count was completed pills were immediately returned to the patient in their original bottle.  Medication: See above Pill Count: 51 of 120 pills remain Bottle Appearance: Standard pharmacy container. Clearly labeled. Filled Date: 60 / 27 /  2017 Medication last intake:04/29/16   Pharmacokinetics: Liberation and absorption (onset of action): WNL Distribution (time to  peak effect): WNL Metabolism and excretion (duration of action): WNL         Pharmacodynamics: Desired effects: Analgesia: The patient reports >50% benefit. Reported improvement in function: The patient reports medication allows him to accomplish basic ADLs. Clinically meaningful improvement in function (CMIF): Sustained CMIF goals met Perceived effectiveness: Described as relatively effective, allowing for increase in activities of daily living (ADL) Undesirable effects: Side-effects or Adverse reactions: None reported Monitoring: [place] PMP: Online review of the past 70-monthperiod conducted. Compliant with practice rules and regulations List of all UDS test(s) done:  Lab Results  Component Value Date   TOXASSSELUR FINAL 08/21/2015   [place]FINAL 06/19/2015   Last UDS on record: ToxAssure Select 13  Date Value Ref Range Status  08/21/2015 FINAL  Final    Comment:    ==================================================================== TOXASSURE SELECT 13 (MW) ==================================================================== Specimen Alert Note:  Urinary creatinine is low; ability to detect some drugs may be compromised.  Interpret results with caution. ==================================================================== Test                             Result       Flag       Units Drug Present and Declared for Prescription Verification   Oxycodone                      8675         EXPECTED   ng/mg creat   Noroxycodone                   21244        EXPECTED   ng/mg creat    Sources of oxycodone include scheduled prescription medications.    Noroxycodone is an expected metabolite of oxycodone. ==================================================================== Test                      Result    Flag   Units      Ref Range   Creatinine               16        L      mg/dL      >=20 ==================================================================== Declared Medications:  The flagging and interpretation on this report are based on the  following declared medications.  Unexpected results may arise from  inaccuracies in the declared medications.  **Note: The testing scope of this panel includes these medications:  Oxycodone (Roxicodone)  **Note: The testing scope of this panel does not include following  reported medications:  Albuterol  Amoxicillin (Augmentin)  Azithromycin  Bisacodyl  Cholecalciferol  Clavulinate (Augmentin)  Diphenhydramine  Diphenhydramine (Benadryl)  Docusate  Docusate (Colace)  Fluoxetine (Prozac)  Furosemide (Lasix)  Gabapentin (Neurontin)  Hydrochlorothiazide  Iron (Ferrous Sulfate)  Levothyroxine  Lisinopril  Loratadine (Claritin-D)  Magnesium  Melatonin  Mometasone (Asmanex)  Multivitamin  Naproxen  Nystatin (Mycostatin)  Omeprazole  Oxybutynin  Pseudoephedrine (Claritin-D)  Sennosides  Supplement  Supplement (Krill Oil)  Tolterodine  Trospium ([place]ura)  Turmeric ==================================================================== For clinical consultation, please call ([phone] ====================================================================    UDS interpretation: Compliant          Medication Assessment Form: Reviewed. Patient indicates being compliant with therapy Treatment compliance: Compliant Risk Assessment Profile: Aberrant behavior: See prior evaluations. None observed or detected today Comorbid factors increasing risk of overdose: See prior notes. No additional risks detected today Risk of substance use disorder (SUD):  Low Opioid Risk Tool (ORT) Total Score: 0  Interpretation Table:  Score <3 = Low Risk for SUD  Score between 4-7 = Moderate Risk for SUD  Score >8 = High Risk for Opioid Abuse   Risk Mitigation Strategies:  Patient Counseling:  Covered Patient-Prescriber Agreement (PPA): Present and active  Notification to other healthcare providers: Done  Pharmacologic Plan: No change in therapy, at this time  Post-Procedure Assessment  04/15/2016 Procedure: Palliative left intra-articular knee injection with local anesthetic and steroid. No sedation. Influential Factors: BMI: 25.06 kg/m Intra-procedural challenges: None observed Assessment challenges: None detected         Post-procedural side-effects, adverse reactions, or complications: None reported Reported issues: None  Sedation: No sedation used. When no sedatives are used, the analgesic levels obtained are directly associated to the effectiveness of the local anesthetics. However, when sedation is provided, the level of analgesia obtained during the initial 1 hour following the intervention, is believed to be the result of a combination of factors. These factors may include, but are not limited to: 1. The effectiveness of the local anesthetics used. 2. The effects of the analgesic(s) and/or anxiolytic(s) used. 3. The degree of discomfort experienced by the patient at the time of the procedure. 4. The patients ability and reliability in recalling and recording the events. 5. The presence and influence of possible secondary gains and/or psychosocial factors. Reported result: Relief experienced during the 1st hour after the procedure: 25 % (Ultra-Short Term Relief) Interpretative annotation: No Analgesic or Anxiolytic given, therefore benefits are completely due to Local Anesthetics.          Effects of local anesthetic: The analgesic effects attained during this period are directly associated to the localized infiltration of local anesthetics and therefore cary significant diagnostic value as to the etiological location, or anatomical origin, of the pain. Expected duration of relief is directly dependent on the pharmacodynamics of the local anesthetic used. Long-acting (4-6  hours) anesthetics used.  Reported result: Relief during the next 4 to 6 hour after the procedure: 25 % (Short-Term Relief) Interpretative annotation: Partial relief. This would suggest incomplete involvement of injected area.          Long-term benefit: Defined as the period of time past the expected duration of local anesthetics. With the possible exception of prolonged sympathetic blockade from the local anesthetics, benefits during this period are typically attributed to, or associated with, other factors such as analgesic sensory neuropraxia, antiinflammatory effects, or beneficial biochemical changes provided by agents other than the local anesthetics Reported result: Extended relief following procedure: 20 % (patient feels as if he will need another injection.  pain comes and goes.  does not rate pain relief at very high %) (Long-Term Relief) Interpretative annotation: Partial relief. This could suggest the algesic mechanism to be a combination of tissue inflammation and mechanical problems.          Current benefits: Defined as persistent relief that continues at this point in time.   Reported results: Treated area: <25 % In addition, the patient reports improvement in function Interpretative annotation: Recurrance of symptoms. This would suggest persistent aggravating factors  Interpretation: Results would suggest area to be involved, however, further evaluation and testing may be required.          Laboratory Chemistry  Inflammation Markers Lab Results  Component Value Date   ESRSEDRATE 45 (H) 08/21/2015   CRP 0.7 08/21/2015   Renal Function Lab Results  Component Value Date   BUN 12  08/21/2015   CREATININE 0.89 08/21/2015   GFRAA >60 08/21/2015   GFRNONAA >60 08/21/2015   Hepatic Function Lab Results  Component Value Date   AST 32 08/21/2015   ALT 19 08/21/2015   ALBUMIN 3.6 08/21/2015   Electrolytes Lab Results  Component Value Date   NA 136 08/21/2015   K 4.1  08/21/2015   CL 101 08/21/2015   CALCIUM 9.1 08/21/2015   MG 1.9 08/21/2015   Pain Modulating Vitamins Lab Results  Component Value Date   VD25OH 29.2 (L) 08/21/2015   VD125OH2TOT 45.5 08/21/2015   VITAMINB12 408 08/21/2015   Coagulation Parameters Lab Results  Component Value Date   PLT 292 03/29/2014   Cardiovascular Lab Results  Component Value Date   HGB 11.5 (L) 03/29/2014   HCT 36.9 (L) 03/29/2014   Note: Lab results reviewed.  Recent Diagnostic Imaging Review  Dg Lumbar Spine Complete W/bend  Result Date: 08/21/2015 CLINICAL DATA:  Back pain.  No known injury. EXAM: LUMBAR SPINE - COMPLETE WITH BENDING VIEWS COMPARISON:  CT 12/26/2013. FINDINGS: Diffuse osteopenia degenerative change. No acute abnormality. Approximately 2 mm anterolisthesis L5 on S1 No flexion or extension deformity. Aortoiliac atherosclerotic vascular disease. IMPRESSION: 1. Diffuse osteopenia and degenerative change. Approximately 2 mm anterolisthesis L5-S1. No flexion or extension deformity. 2. Aortoiliac atherosclerotic vascular disease . Electronically Signed   By: Marcello Moores  Register   On: 08/21/2015 12:08   Note: Imaging results reviewed.  Meds  The patient has a current medication list which includes the following prescription(s): albuterol, diphenhydramine, fluoxetine, fluticasone, furosemide, gabapentin, levothyroxine, lisinopril, magnesium, naloxone hcl, omeprazole, oxycodone, oxycodone, senna-docusate, turmeric, beclomethasone, vitamin d3, and oxycodone.  Current Outpatient Prescriptions on File Prior to Visit  Medication Sig  . albuterol (PROVENTIL HFA;VENTOLIN HFA) 108 (90 BASE) MCG/ACT inhaler Inhale 2 puffs into the lungs every 6 (six) hours as needed for wheezing or shortness of breath.  . diphenhydrAMINE (BENADRYL) 25 mg capsule Take 50 mg by mouth at bedtime as needed. Reported on 08/21/2015  . fluticasone (FLOVENT HFA) 220 MCG/ACT inhaler INHALE 2 PUFFS BID  . furosemide (LASIX) 40 MG  tablet TK 1 T PO QAM AND 1/2 T QHS  . levothyroxine (SYNTHROID, LEVOTHROID) 25 MCG tablet Take 25 mcg by mouth daily before breakfast.  . lisinopril (PRINIVIL,ZESTRIL) 20 MG tablet Take 20 mg by mouth daily.  . Magnesium 500 MG CAPS Take 500 mg by mouth daily.  . Naloxone HCl (NARCAN) 4 MG/0.1ML LIQD Place 1 spray into the nose once. Spray half of bottle content into each nostril, then call 911  . senna-docusate (SENNA-S) 8.6-50 MG tablet 1 tablet at bedtime as needed.   . TURMERIC PO Take 900 mg by mouth daily.  . beclomethasone (QVAR) 40 MCG/ACT inhaler Inhale 2 puffs into the lungs 2 (two) times daily.   . Cholecalciferol (VITAMIN D3) 1000 units CAPS Take 5,000 Units by mouth daily at 6 (six) AM.    No current facility-administered medications on file prior to visit.    ROS  Constitutional: Denies any fever or chills Gastrointestinal: No reported hemesis, hematochezia, vomiting, or acute GI distress Musculoskeletal: Denies any acute onset joint swelling, redness, loss of ROM, or weakness Neurological: No reported episodes of acute onset apraxia, aphasia, dysarthria, agnosia, amnesia, paralysis, loss of coordination, or loss of consciousness  Allergies  Mr. Alvillar is allergic to morphine and related and ciprofloxacin.  PFSH  Drug: Mr. Googe  reports that he does not use drugs. Alcohol:  reports that he does not drink  alcohol. Tobacco:  reports that he quit smoking about 9 years ago. His smoking use included Cigarettes. He has a 125.00 pack-year smoking history. He has never used smokeless tobacco. Medical:  has a past medical history of Anemia; Anxiety; Arthritis; Cancer ([place]); Depression; and Swallowing difficulty. Family: family history includes Cancer in his mother; Diabetes in his father; Hypertension in his father; Lung disease in his father; Parkinson's disease in his father.  Past Surgical History:  Procedure Laterality Date  . CT RADIATION THERAPY GUIDE     left lung and  liver 2015  . HEAD & NECK WOUND REPAIR / CLOSURE     100 strutures  . [place]  . LUNG REMOVAL, PARTIAL     2014  . TONSILLECTOMY     1957  . TUMOR REMOVAL  06/03/2015   liver tumor  . WRIST FRACTURE SURGERY     Constitutional Exam  General appearance: Well nourished, well developed, and well hydrated. In no apparent acute distress Vitals:   04/29/16 1302  BP: 139/78  Pulse: 71  Resp: 16  SpO2: 100%  Weight: 160 lb (72.6 kg)  Height: _0  (1.702 m)   BMI Assessment: Estimated body mass index is 25.06 kg/m as calculated from the following:   Height as of this encounter: _1  (1.702 m).   Weight as of this encounter: 160 lb (72.6 kg).  BMI interpretation table: BMI level Category Range association with higher incidence of chronic pain  <18 kg/m2 Underweight   18.5-24.9 kg/m2 Ideal body weight   25-29.9 kg/m2 Overweight Increased incidence by 20%  30-34.9 kg/m2 Obese (Class I) Increased incidence by 68%  35-39.9 kg/m2 Severe obesity (Class II) Increased incidence by 136%  >40 kg/m2 Extreme obesity (Class III) Increased incidence by 254%   BMI Readings from Last 4 Encounters:  04/29/16 25.06 kg/m  04/02/16 25.06 kg/m  03/05/16 25.82 kg/m  11/20/15 28.25 kg/m   Wt Readings from Last 4 Encounters:  04/29/16 160 lb (72.6 kg)  04/02/16 160 lb (72.6 kg)  03/05/16 160 lb (72.6 kg)  11/20/15 175 lb (79.4 kg)  Psych/Mental status: Alert, oriented x 3 (person, place, & time) Eyes: PERLA Respiratory: No evidence of acute respiratory distress  Cervical Spine Exam  Inspection: No masses, redness, or swelling Alignment: Symmetrical Functional ROM: Unrestricted ROM Stability: No instability detected Muscle strength & Tone: Functionally intact Sensory: Unimpaired Palpation: Non-contributory  Upper Extremity (UE) Exam    Side: Right upper extremity  Side: Left upper extremity  Inspection: No masses, redness, swelling, or asymmetry  Inspection: No  masses, redness, swelling, or asymmetry  Functional ROM: Unrestricted ROM          Functional ROM: Unrestricted ROM          Muscle strength & Tone: Functionally intact  Muscle strength & Tone: Functionally intact  Sensory: Unimpaired  Sensory: Unimpaired  Palpation: Non-contributory  Palpation: Non-contributory   Thoracic Spine Exam  Inspection: No masses, redness, or swelling Alignment: Symmetrical Functional ROM: Unrestricted ROM Stability: No instability detected Sensory: Unimpaired Muscle strength & Tone: Functionally intact Palpation: Non-contributory  Lumbar Spine Exam  Inspection: No masses, redness, or swelling Alignment: Symmetrical Functional ROM: Unrestricted ROM Stability: No instability detected Muscle strength & Tone: Functionally intact Sensory: Unimpaired Palpation: Non-contributory Provocative Tests: Lumbar Hyperextension and rotation test: evaluation deferred today       Patrick's Maneuver: evaluation deferred today              Gait &  Posture Assessment  Ambulation: Unassisted Gait: Relatively normal for age and body habitus Posture: WNL   Lower Extremity Exam    Side: Right lower extremity  Side: Left lower extremity  Inspection: No masses, redness, swelling, or asymmetry  Inspection: No masses, redness, swelling, or asymmetry  Functional ROM: Unrestricted ROM          Functional ROM: Unrestricted ROM          Muscle strength & Tone: Functionally intact  Muscle strength & Tone: Functionally intact  Sensory: Unimpaired  Sensory: Unimpaired  Palpation: Non-contributory  Palpation: Non-contributory   Assessment  Primary Diagnosis & Pertinent Problem List: The primary encounter diagnosis was Chronic pain syndrome. Diagnoses of Cancer related pain, Long term current use of opiate analgesic, Opiate use (90 MME/Day), Neuropathic pain, Neurogenic pain, Chronic knee pain (Bilateral) (L>R), and Primary osteoarthritis of left knee were also pertinent to this  visit.  Visit Diagnosis: 1. Chronic pain syndrome   2. Cancer related pain   3. Long term current use of opiate analgesic   4. Opiate use (90 MME/Day)   5. Neuropathic pain   6. Neurogenic pain   7. Chronic knee pain (Bilateral) (L>R)   8. Primary osteoarthritis of left knee    Plan of Care  Pharmacotherapy (Medications Ordered): Meds ordered this encounter  Medications  . oxyCODONE (ROXICODONE) 15 MG immediate release tablet    Sig: Take 1 tablet (15 mg total) by mouth every 6 (six) hours as needed for pain.    Dispense:  120 tablet    Refill:  0    Do not place this medication, or any other prescription from our practice, on "Automatic Refill". Patient may have prescription filled one day early if pharmacy is closed on scheduled refill date. Do not fill until: 08/09/16 To last until: 09/08/16  . oxyCODONE (ROXICODONE) 15 MG immediate release tablet    Sig: Take 1 tablet (15 mg total) by mouth every 6 (six) hours as needed for pain.    Dispense:  120 tablet    Refill:  0    Do not place this medication, or any other prescription from our practice, on "Automatic Refill". Patient may have prescription filled one day early if pharmacy is closed on scheduled refill date. Do not fill until: 06/10/16 To last until: 07/10/16  . oxyCODONE (ROXICODONE) 15 MG immediate release tablet    Sig: Take 1 tablet (15 mg total) by mouth every 6 (six) hours as needed for pain.    Dispense:  120 tablet    Refill:  0    Do not place this medication, or any other prescription from our practice, on "Automatic Refill". Patient may have prescription filled one day early if pharmacy is closed on scheduled refill date. Do not fill until: 07/10/16 To last until: 08/09/16  . gabapentin (NEURONTIN) 800 MG tablet    Sig: Take 1 tablet (800 mg total) by mouth 3 (three) times daily.    Dispense:  90 tablet    Refill:  2    Do not place this medication, or any other prescription from our practice, on  "Automatic Refill". Patient may have prescription filled one day early if pharmacy is closed on scheduled refill date.   New Prescriptions   No medications on file   Medications administered today: Mr. Norgaard had no medications administered during this visit. Lab-work, procedure(s), and/or referral(s): Orders Placed This Encounter  Procedures  . GENICULAR NERVE BLOCK   Imaging and/or referral(s): None  Interventional therapies: Planned, scheduled, and/or pending:   Left diagnostic genicular nerve block under fluoroscopic guidance and IV sedation    Considering:   Diagnostic bilateral cervical facet block under fluoroscopic guidance and IV sedation.  Possible cervical facet radiofrequency ablation  Diagnostic right-sided celiac plexus block under fluoroscopic guidance and IV sedation.  Possible celiac plexus neurolysis.  Diagnostic right-sided sacroiliac joint block under fluoroscopic guidance, with or without sedation.  Possible right sided sacroiliac joint radiofrequency ablation.  Diagnostic bilateral intra-articular knee injection, without fluoroscopy or IV sedation.  Possible series of intra-articular Hyalgan knee injections without fluoroscopy or IV sedation.  Diagnostic bilateral genicular nerve block under fluoroscopic guidance and IV sedation.  Possible bilateral genicular nerve radiofrequency ablation.  Diagnostic bilateral lumbar facet block under fluoroscopic guidance and IV sedation.  Possible bilateral lumbar facet radiofrequency ablation.  Diagnostic right-sided L4-5 lumbar epidural steroid injection under fluoroscopic guidance, with or without sedation.  Diagnostic left sided cervical epidural steroid injection under fluoroscopic guidance, with or without sedation.  Diagnostic left sided intra-articular shoulder joint injection under fluoroscopic guidance, without sedation.  Diagnostic left sided suprascapular nerve block under fluoroscopic guidance, no sedation.   Possible left sided suprascapular nerve radiofrequency ablation under fluoroscopic guidance and IV sedation.    Palliative PRN treatment(s):   Diagnostic bilateral cervical facet block under fluoroscopic guidance and IV sedation.  Diagnostic right-sided celiac plexus block under fluoroscopic guidance and IV sedation.  Diagnostic right-sided sacroiliac joint block under fluoroscopic guidance, with or without sedation.  Diagnostic bilateral intra-articular knee injection, without fluoroscopy or IV sedation.  Diagnostic bilateral genicular nerve block under fluoroscopic guidance and IV sedation.  Diagnostic bilateral lumbar facet block under fluoroscopic guidance and IV sedation.  Diagnostic right-sided L4-5 lumbar epidural steroid injection under fluoroscopic guidance, with or without sedation.  Diagnostic left sided cervical epidural steroid injection under fluoroscopic guidance, with or without sedation.  Diagnostic left sided intra-articular shoulder joint injection under fluoroscopic guidance, without sedation.  Diagnostic left sided suprascapular nerve block under fluoroscopic guidance, no sedation.    Provider-requested follow-up: Return in about 3 months (around 07/30/2016) for Med-Mgmt, in addition, procedure, (ASAP).  Future Appointments Date Time Provider [place]  08/06/2016 1:15 PM Milinda Pointer, MD [facility] None   Primary Care Physician: Theotis Burrow, MD Location: [facility] Note by: Kathlen Brunswick. Dossie Arbour, M.D, DABA, DABAPM, DABPM, DABIPP, FIPP  Pain Score Disclaimer: We use the NRS-11 scale. This is a self-reported, subjective measurement of pain severity with only modest accuracy. It is used primarily to identify changes within a particular patient. It must be understood that outpatient pain scales are significantly less accurate that those used for research, where they can be applied under ideal controlled circumstances with  minimal exposure to variables. In reality, the score is likely to be a combination of pain intensity and pain affect, where pain affect describes the degree of emotional arousal or changes in action readiness caused by the sensory experience of pain. Factors such as social and work situation, setting, emotional state, anxiety levels, expectation, and prior pain experience may influence pain perception and show large inter-individual differences that may also be affected by time variables.  Patient instructions provided during this appointment: Patient Instructions   GENERAL RISKS AND COMPLICATIONS  What are the risk, side effects and possible complications? Generally speaking, most procedures are safe.  However, with any procedure there are risks, side effects, and the possibility of complications.  The risks and complications are dependent upon the sites that  are lesioned, or the type of nerve block to be performed.  The closer the procedure is to the spine, the more serious the risks are.  Great care is taken when placing the radio frequency needles, block needles or lesioning probes, but sometimes complications can occur. 1. Infection: Any time there is an injection through the skin, there is a risk of infection.  This is why sterile conditions are used for these blocks.  There are four possible types of infection. 1. Localized skin infection. 2. Central Nervous System Infection-This can be in the form of Meningitis, which can be deadly. 3. Epidural Infections-This can be in the form of an epidural abscess, which can cause pressure inside of the spine, causing compression of the spinal cord with subsequent paralysis. This would require an emergency surgery to decompress, and there are no guarantees that the patient would recover from the paralysis. 4. Discitis-This is an infection of the intervertebral discs.  It occurs in about 1% of discography procedures.  It is difficult to treat and it may lead  to surgery.        2. Pain: the needles have to go through skin and soft tissues, will cause soreness.       3. Damage to internal structures:  The nerves to be lesioned may be near blood vessels or    other nerves which can be potentially damaged.       4. Bleeding: Bleeding is more common if the patient is taking blood thinners such as  aspirin, Coumadin, Ticiid, Plavix, etc., or if he/she have some genetic predisposition  such as hemophilia. Bleeding into the spinal canal can cause compression of the spinal  cord with subsequent paralysis.  This would require an emergency surgery to  decompress and there are no guarantees that the patient would recover from the  paralysis.       5. Pneumothorax:  Puncturing of a lung is a possibility, every time a needle is introduced in  the area of the chest or upper back.  Pneumothorax refers to free air around the  collapsed lung(s), inside of the thoracic cavity (chest cavity).  Another two possible  complications related to a similar event would include: Hemothorax and Chylothorax.   These are variations of the Pneumothorax, where instead of air around the collapsed  lung(s), you may have blood or chyle, respectively.       6. Spinal headaches: They may occur with any procedures in the area of the spine.       7. Persistent CSF (Cerebro-Spinal Fluid) leakage: This is a rare problem, but may occur  with prolonged intrathecal or epidural catheters either due to the formation of a fistulous  track or a dural tear.       8. Nerve damage: By working so close to the spinal cord, there is always a possibility of  nerve damage, which could be as serious as a permanent spinal cord injury with  paralysis.       9. Death:  Although rare, severe deadly allergic reactions known as "Anaphylactic  reaction" can occur to any of the medications used.      10. Worsening of the symptoms:  We can always make thing worse.  What are the chances of something like this  happening? Chances of any of this occuring are extremely low.  By statistics, you have more of a chance of getting killed in a motor vehicle accident: while driving to the hospital than any of the  above occurring .  Nevertheless, you should be aware that they are possibilities.  In general, it is similar to taking a shower.  Everybody knows that you can slip, hit your head and get killed.  Does that mean that you should not shower again?  Nevertheless always keep in mind that statistics do not mean anything if you happen to be on the wrong side of them.  Even if a procedure has a 1 (one) in a 1,000,000 (million) chance of going wrong, it you happen to be that one..Also, keep in mind that by statistics, you have more of a chance of having something go wrong when taking medications.  Who should not have this procedure? If you are on a blood thinning medication (e.g. Coumadin, Plavix, see list of "Blood Thinners"), or if you have an active infection going on, you should not have the procedure.  If you are taking any blood thinners, please inform your physician.  How should I prepare for this procedure?  Do not eat or drink anything at least six hours prior to the procedure.  Bring a driver with you .  It cannot be a taxi.  Come accompanied by an adult that can drive you back, and that is strong enough to help you if your legs get weak or numb from the local anesthetic.  Take all of your medicines the morning of the procedure with just enough water to swallow them.  If you have diabetes, make sure that you are scheduled to have your procedure done first thing in the morning, whenever possible.  If you have diabetes, take only half of your insulin dose and notify our nurse that you have done so as soon as you arrive at the clinic.  If you are diabetic, but only take blood sugar pills (oral hypoglycemic), then do not take them on the morning of your procedure.  You may take them after you have had the  procedure.  Do not take aspirin or any aspirin-containing medications, at least eleven (11) days prior to the procedure.  They may prolong bleeding.  Wear loose fitting clothing that may be easy to take off and that you would not mind if it got stained with Betadine or blood.  Do not wear any jewelry or perfume  Remove any nail coloring.  It will interfere with some of our monitoring equipment.  NOTE: Remember that this is not meant to be interpreted as a complete list of all possible complications.  Unforeseen problems may occur.  BLOOD THINNERS The following drugs contain aspirin or other products, which can cause increased bleeding during surgery and should not be taken for 2 weeks prior to and 1 week after surgery.  If you should need take something for relief of minor pain, you may take acetaminophen which is found in Tylenol,m Datril, Anacin-3 and Panadol. It is not blood thinner. The products listed below are.  Do not take any of the products listed below in addition to any listed on your instruction sheet.  A.P.C or A.P.C with Codeine Codeine Phosphate Capsules #3 Ibuprofen Ridaura  ABC compound Congesprin Imuran rimadil  Advil Cope Indocin Robaxisal  Alka-Seltzer Effervescent Pain Reliever and Antacid Coricidin or Coricidin-D  Indomethacin Rufen  Alka-Seltzer plus Cold Medicine Cosprin Ketoprofen S-A-C Tablets  Anacin Analgesic Tablets or Capsules Coumadin Korlgesic Salflex  Anacin Extra Strength Analgesic tablets or capsules CP-2 Tablets Lanoril Salicylate  Anaprox Cuprimine Capsules Levenox Salocol  Anexsia-D Dalteparin Magan Salsalate  Anodynos Darvon compound Magnesium  Salicylate Sine-off  Ansaid Dasin Capsules Magsal Sodium Salicylate  Anturane Depen Capsules Marnal Soma  APF Arthritis pain formula Dewitt's Pills Measurin Stanback  Argesic Dia-Gesic Meclofenamic Sulfinpyrazone  Arthritis Bayer Timed Release Aspirin Diclofenac Meclomen Sulindac  Arthritis pain formula  Anacin Dicumarol Medipren Supac  Analgesic (Safety coated) Arthralgen Diffunasal Mefanamic Suprofen  Arthritis Strength Bufferin Dihydrocodeine Mepro Compound Suprol  Arthropan liquid Dopirydamole Methcarbomol with Aspirin Synalgos  ASA tablets/Enseals Disalcid Micrainin Tagament  Ascriptin Doan's Midol Talwin  Ascriptin A/D Dolene Mobidin Tanderil  Ascriptin Extra Strength Dolobid Moblgesic Ticlid  Ascriptin with Codeine Doloprin or Doloprin with Codeine Momentum Tolectin  Asperbuf Duoprin Mono-gesic Trendar  Aspergum Duradyne Motrin or Motrin IB Triminicin  Aspirin plain, buffered or enteric coated Durasal Myochrisine Trigesic  Aspirin Suppositories Easprin Nalfon Trillsate  Aspirin with Codeine Ecotrin Regular or Extra Strength Naprosyn Uracel  Atromid-S Efficin Naproxen Ursinus  Auranofin Capsules Elmiron Neocylate Vanquish  Axotal Emagrin Norgesic Verin  Azathioprine Empirin or Empirin with Codeine Normiflo Vitamin E  Azolid Emprazil Nuprin Voltaren  Bayer Aspirin plain, buffered or children's or timed BC Tablets or powders Encaprin Orgaran Warfarin Sodium  Buff-a-Comp Enoxaparin Orudis Zorpin  Buff-a-Comp with Codeine Equegesic Os-Cal-Gesic   Buffaprin Excedrin plain, buffered or Extra Strength Oxalid   Bufferin Arthritis Strength Feldene Oxphenbutazone   Bufferin plain or Extra Strength Feldene Capsules Oxycodone with Aspirin   Bufferin with Codeine Fenoprofen Fenoprofen Pabalate or Pabalate-SF   Buffets II Flogesic Panagesic   Buffinol plain or Extra Strength Florinal or Florinal with Codeine Panwarfarin   Buf-Tabs Flurbiprofen Penicillamine   Butalbital Compound Four-way cold tablets Penicillin   Butazolidin Fragmin Pepto-Bismol   Carbenicillin Geminisyn Percodan   Carna Arthritis Reliever Geopen Persantine   Carprofen Gold's salt Persistin   Chloramphenicol Goody's Phenylbutazone   Chloromycetin Haltrain Piroxlcam   Clmetidine heparin Plaquenil   Cllnoril Hyco-pap  Ponstel   Clofibrate Hydroxy chloroquine Propoxyphen         Before stopping any of these medications, be sure to consult the physician who ordered them.  Some, such as Coumadin (Warfarin) are ordered to prevent or treat serious conditions such as "deep thrombosis", "pumonary embolisms", and other heart problems.  The amount of time that you may need off of the medication may also vary with the medication and the reason for which you were taking it.  If you are taking any of these medications, please make sure you notify your pain physician before you undergo any procedures.         Knee Injection A knee injection is a procedure to get medicine into your knee joint. Your health care provider puts a needle into the joint and injects medicine with an attached syringe. The injected medicine may relieve the pain, swelling, and stiffness of arthritis. The injected medicine may also help to lubricate and cushion your knee joint. You may need more than one injection. Tell a health care provider about:  Any allergies you have.  All medicines you are taking, including vitamins, herbs, eye drops, creams, and over-the-counter medicines.  Any problems you or family members have had with anesthetic medicines.  Any blood disorders you have.  Any surgeries you have had.  Any medical conditions you have. What are the risks? Generally, this is a safe procedure. However, problems may occur, including:  Infection.  Bleeding.  Worsening symptoms.  Damage to the area around your knee.  Allergic reaction to any of the medicines.  Skin reactions from repeated injections. What happens before the procedure?  Ask your health care provider about changing or stopping your regular medicines. This is especially important if you are taking diabetes medicines or blood thinners.  Plan to have someone take you home after the procedure. What happens during the procedure?  You will sit or lie down in a  position for your knee to be treated.  The skin over your kneecap will be cleaned with a germ-killing solution (antiseptic).  You will be given a medicine that numbs the area (local anesthetic). You may feel some stinging.  After your knee becomes numb, you will have a second injection. This is the medicine. This needle is carefully placed between your kneecap and your knee. The medicine is injected into the joint space.  At the end of the procedure, the needle will be removed.  A bandage (dressing) may be placed over the injection site. The procedure may vary among health care providers and hospitals. What happens after the procedure?  You may have to move your knee through its full range of motion. This helps to get all of the medicine into your joint space.  Your blood pressure, heart rate, breathing rate, and blood oxygen level will be monitored often until the medicines you were given have worn off.  You will be watched to make sure that you do not have a reaction to the injected medicine. This information is not intended to replace advice given to you by your health care provider. Make sure you discuss any questions you have with your health care provider. Document Released: 08/23/2006 Document Revised: 11/01/2015 Document Reviewed: 04/11/2014 Elsevier Interactive Patient Education  2017 [employer].

## 2016-05-13 ENCOUNTER — Emergency Department: Payer: Medicare HMO

## 2016-05-13 ENCOUNTER — Encounter: Payer: Self-pay | Admitting: Emergency Medicine

## 2016-05-13 ENCOUNTER — Emergency Department
Admission: EM | Admit: 2016-05-13 | Discharge: 2016-05-13 | Payer: Medicare HMO | Attending: Emergency Medicine | Admitting: Emergency Medicine

## 2016-05-13 DIAGNOSIS — W19XXXA Unspecified fall, initial encounter: Secondary | ICD-10-CM | POA: Insufficient documentation

## 2016-05-13 DIAGNOSIS — K7290 Hepatic failure, unspecified without coma: Secondary | ICD-10-CM

## 2016-05-13 DIAGNOSIS — Y929 Unspecified place or not applicable: Secondary | ICD-10-CM | POA: Diagnosis not present

## 2016-05-13 DIAGNOSIS — Z8505 Personal history of malignant neoplasm of liver: Secondary | ICD-10-CM | POA: Diagnosis not present

## 2016-05-13 DIAGNOSIS — N179 Acute kidney failure, unspecified: Secondary | ICD-10-CM

## 2016-05-13 DIAGNOSIS — K767 Hepatorenal syndrome: Secondary | ICD-10-CM | POA: Diagnosis not present

## 2016-05-13 DIAGNOSIS — Y999 Unspecified external cause status: Secondary | ICD-10-CM | POA: Insufficient documentation

## 2016-05-13 DIAGNOSIS — R4182 Altered mental status, unspecified: Secondary | ICD-10-CM | POA: Diagnosis present

## 2016-05-13 DIAGNOSIS — Z87891 Personal history of nicotine dependence: Secondary | ICD-10-CM | POA: Diagnosis not present

## 2016-05-13 DIAGNOSIS — Y939 Activity, unspecified: Secondary | ICD-10-CM | POA: Diagnosis not present

## 2016-05-13 LAB — COMPREHENSIVE METABOLIC PANEL
ALT: 41 U/L (ref 17–63)
AST: 75 U/L — AB (ref 15–41)
Albumin: 2.4 g/dL — ABNORMAL LOW (ref 3.5–5.0)
Alkaline Phosphatase: 283 U/L — ABNORMAL HIGH (ref 38–126)
Anion gap: 15 (ref 5–15)
BUN: 46 mg/dL — AB (ref 6–20)
CHLORIDE: 96 mmol/L — AB (ref 101–111)
CO2: 19 mmol/L — AB (ref 22–32)
CREATININE: 4.75 mg/dL — AB (ref 0.61–1.24)
Calcium: 8.6 mg/dL — ABNORMAL LOW (ref 8.9–10.3)
GFR calc Af Amer: 13 mL/min — ABNORMAL LOW (ref >60)
GFR calc non Af Amer: 11 mL/min — ABNORMAL LOW (ref >60)
Glucose, Bld: 121 mg/dL — ABNORMAL HIGH (ref 65–99)
Potassium: 3.7 mmol/L (ref 3.5–5.1)
SODIUM: 130 mmol/L — AB (ref 135–145)
Total Bilirubin: 20 mg/dL (ref 0.3–1.2)
Total Protein: 6.7 g/dL (ref 6.5–8.1)

## 2016-05-13 LAB — CBC WITH DIFFERENTIAL/PLATELET
BASOS PCT: 1 %
Basophils Absolute: 0.1 10*3/uL (ref 0–0.1)
Eosinophils Absolute: 0 10*3/uL (ref 0–0.7)
Eosinophils Relative: 0 %
HEMATOCRIT: 29.5 % — AB (ref 40.0–52.0)
HEMOGLOBIN: 10.3 g/dL — AB (ref 13.0–18.0)
LYMPHS ABS: 0.4 10*3/uL — AB (ref 1.0–3.6)
Lymphocytes Relative: 3 %
MCH: 28.5 pg (ref 26.0–34.0)
MCHC: 34.9 g/dL (ref 32.0–36.0)
MCV: 81.8 fL (ref 80.0–100.0)
MONOS PCT: 6 %
Monocytes Absolute: 0.8 10*3/uL (ref 0.2–1.0)
NEUTROS ABS: 12.2 10*3/uL — AB (ref 1.4–6.5)
NEUTROS PCT: 90 %
Platelets: 268 10*3/uL (ref 150–440)
RBC: 3.61 MIL/uL — ABNORMAL LOW (ref 4.40–5.90)
RDW: 21.5 % — ABNORMAL HIGH (ref 11.5–14.5)
WBC: 13.5 10*3/uL — ABNORMAL HIGH (ref 3.8–10.6)

## 2016-05-13 LAB — PROTIME-INR
INR: 1.31
Prothrombin Time: 16.4 seconds — ABNORMAL HIGH (ref 11.4–15.2)

## 2016-05-13 LAB — URINALYSIS COMPLETE WITH MICROSCOPIC (ARMC ONLY)
Bacteria, UA: NONE SEEN
Glucose, UA: 50 mg/dL — AB
KETONES UR: NEGATIVE mg/dL
LEUKOCYTES UA: NEGATIVE
NITRITE: NEGATIVE
PH: 5 (ref 5.0–8.0)
PROTEIN: 30 mg/dL — AB
SPECIFIC GRAVITY, URINE: 1.013 (ref 1.005–1.030)

## 2016-05-13 LAB — BILIRUBIN, DIRECT: Bilirubin, Direct: 13.2 mg/dL — ABNORMAL HIGH (ref 0.1–0.5)

## 2016-05-13 LAB — LIPASE, BLOOD: LIPASE: 53 U/L — AB (ref 11–51)

## 2016-05-13 LAB — SALICYLATE LEVEL: Salicylate Lvl: 8.6 mg/dL (ref 2.8–30.0)

## 2016-05-13 LAB — ETHANOL: Alcohol, Ethyl (B): <5 mg/dL (ref <5)

## 2016-05-13 LAB — ACETAMINOPHEN LEVEL

## 2016-05-13 LAB — AMMONIA: Ammonia: 62 umol/L — ABNORMAL HIGH (ref 9–35)

## 2016-05-13 MED ORDER — NOREPINEPHRINE BITARTRATE 1 MG/ML IV SOLN: 2.0000 ug/min | Freq: Once | INTRAVENOUS | Status: DC

## 2016-05-13 MED ORDER — LACTULOSE 10 GM/15ML PO SOLN
20.0000 g | Freq: Once | ORAL | Status: AC
  Administered 2016-05-13: 20 g via ORAL
  Filled 2016-05-13: qty 30

## 2016-05-13 MED ORDER — SODIUM CHLORIDE 0.9 % IV BOLUS (SEPSIS)
1000.0000 mL | Freq: Once | INTRAVENOUS | Status: AC
  Administered 2016-05-13: 1000 mL via INTRAVENOUS

## 2016-05-13 MED ORDER — NOREPINEPHRINE 4 MG/250ML-% IV SOLN
2.0000 ug/min | INTRAVENOUS | Status: DC
  Administered 2016-05-13: 2 ug/min via INTRAVENOUS
  Filled 2016-05-13: qty 250

## 2016-05-13 NOTE — ED Notes (Signed)
Pt's pressure dropped into 70s, rechecked, MD notified, order received for fluids. Pt alert and oriented times 4, denies dizziness while he's laying down.

## 2016-05-13 NOTE — ED Triage Notes (Signed)
Pt arrived via EMS from home. Pt has hx of liver CA, reports that he has noticed an increase in his jaundice which he first noticed over a month ago and reported this to his PCP. Today pt has had 4 or 5 falls, has experienced N/V, dizziness, has had decreased PO intake. Upon arrival to ER, pt was transferring from EMS stretcher to ER stretcher and became weak and almost fell but was caught by EMS staff. EMS staff reported to this RN that during transport pt appeared to be having hallucinations and was grabbing at things that were not there. During triage, pt alert and oriented tiomes 4

## 2016-05-13 NOTE — ED Provider Notes (Signed)
[facility] Emergency Department Provider Note  ____________________________________________  Time seen: Approximately 7:05 PM  I have reviewed the triage vital signs and the nursing notes.   HISTORY  Chief Complaint Jaundice; Altered Mental Status; and Fall    HPI [facility] Brooke Bonito. is a 70 y.o. male brought to the ED due to generalized weakness and multiple falls. Patient reports that he has liver cancer and is followed by [facility]. He supposed to be on lactulose but stopped taking it several months ago because it tastes nasty. He's noticed that his skin and eyes have become increasingly yellow. Denies any recent illness. No fevers chills cough chest pain or shortness of breath. Every time he walks he feels unsteady. Denies headache.     Past Medical History:  Diagnosis Date  . Anemia    Diagnosed Sept 16  . Anxiety   . Arthritis    neck  . Cancer ([place])   . Depression   . Swallowing difficulty    3/17     Patient Active Problem List   Diagnosis Date Noted  . Osteoarthritis of knee (Left) 04/29/2016  . Lumbar facet syndrome (Location of Primary Source of Pain) (Bilateral) (R>L) 11/20/2015  . Cervical spondylosis 11/20/2015  . Chronic shoulder pain (Left) 11/20/2015  . Chronic abdominal pain (RUQ) 11/20/2015  . Vitamin D insufficiency 08/29/2015  . Low testosterone 08/29/2015  . Grade 1 Anterolisthesis (2 mm) of L5 over S1 (stable) 08/29/2015  . Lumbar spondylosis 08/29/2015  . Chronic lower extremity pain (Location of Secondary source of pain) (Bilateral) (R>L) 08/21/2015  . Chronic groin pain (Right) 08/21/2015  . Osteoporosis, idiopathic 08/21/2015  . Long term current use of opiate analgesic 06/19/2015  . Opiate use (90 MME/Day) 06/19/2015  . Encounter for therapeutic drug level monitoring 06/19/2015  . Chronic neck pain 06/19/2015  . Encounter for chronic pain management 06/19/2015  . Neurogenic pain 06/19/2015  . Opioid-induced  constipation (OIC) 06/19/2015  . Hypomagnesemia 06/19/2015  . Insomnia secondary to chronic pain 06/19/2015  . Chronic pain syndrome 03/19/2015  . Medical marijuana use 03/19/2015  . Chronic knee pain (Bilateral) (L>R) 03/19/2015  . Long term prescription opiate use 03/19/2015  . Cancer related pain 03/19/2015  . Chronic low back pain (Location of Primary Source of Pain) (Bilateral) (R>L) 03/19/2015  . Colon, diverticulosis 02/18/2014  . Chronic hepatitis C without hepatic coma ([place]) 02/18/2014  . Hepatocellular carcinoma ([place]) 05/05/2013  . Malignant neoplasm of liver ([place]) 05/05/2013  . 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 Chronic hepatitis C virus infection ([place]) 03/14/2013  . Disease of liver 03/14/2013  . Hepatic cirrhosis ([place]) 03/14/2013  . Alcoholism ([place]) 11/19/2012  . Chronic obstructive pulmonary disease ([place]) 11/19/2012  . Clinical depression 11/19/2012  . ED (erectile dysfunction) of organic origin 11/19/2012  . BP (high blood pressure) 11/19/2012  . Adult hypothyroidism 11/19/2012  . Malignant neoplasm of prostate ([place]) 01/02/2010     Past Surgical History:  Procedure Laterality Date  . CT RADIATION THERAPY GUIDE     left lung and liver 2015  . HEAD & NECK WOUND REPAIR / CLOSURE     100 strutures  . [place]  . LUNG REMOVAL, PARTIAL     2014  . TONSILLECTOMY     1957  . TUMOR REMOVAL  06/03/2015   liver tumor  . WRIST FRACTURE SURGERY       Prior to Admission medications   Medication  Sig Start Date End Date Taking? Authorizing Provider  albuterol (PROVENTIL HFA;VENTOLIN HFA) 108 (90 BASE) MCG/ACT inhaler Inhale 2 puffs into the lungs every 6 (six) hours as needed for wheezing or shortness of breath.    Historical Provider, MD  beclomethasone (QVAR) 40 MCG/ACT inhaler Inhale 2 puffs into the lungs 2 (two) times daily.  11/25/12   Historical Provider, MD  Cholecalciferol (VITAMIN D3) 1000 units CAPS  Take 5,000 Units by mouth daily at 6 (six) AM.     Historical Provider, MD  diphenhydrAMINE (BENADRYL) 25 mg capsule Take 50 mg by mouth at bedtime as needed. Reported on 08/21/2015    Historical Provider, MD  FLUoxetine (PROZAC) 40 MG capsule Take 40 mg by mouth at bedtime.  03/30/16   Historical Provider, MD  fluticasone (FLOVENT HFA) 220 MCG/ACT inhaler INHALE 2 PUFFS BID 10/23/15   Historical Provider, MD  furosemide (LASIX) 40 MG tablet TK 1 T PO QAM AND 1/2 T QHS 02/07/16   Historical Provider, MD  gabapentin (NEURONTIN) 800 MG tablet Take 1 tablet (800 mg total) by mouth 3 (three) times daily. 06/10/16 09/08/16  Milinda Pointer, MD  levothyroxine (SYNTHROID, LEVOTHROID) 25 MCG tablet Take 25 mcg by mouth daily before breakfast.    Historical Provider, MD  lisinopril (PRINIVIL,ZESTRIL) 20 MG tablet Take 20 mg by mouth daily.    Historical Provider, MD  Magnesium 500 MG CAPS Take 500 mg by mouth daily.    Historical Provider, MD  Naloxone HCl (NARCAN) 4 MG/0.1ML LIQD Place 1 spray into the nose once. Spray half of bottle content into each nostril, then call 911 08/29/15   Milinda Pointer, MD  omeprazole (PRILOSEC) 20 MG capsule TAKE 1 CAPSULE(20 MG) BY MOUTH EVERY DAY 04/07/16   Historical Provider, MD  oxyCODONE (ROXICODONE) 15 MG immediate release tablet Take 1 tablet (15 mg total) by mouth every 6 (six) hours as needed for pain. 08/09/16 09/08/16  Milinda Pointer, MD  oxyCODONE (ROXICODONE) 15 MG immediate release tablet Take 1 tablet (15 mg total) by mouth every 6 (six) hours as needed for pain. 06/10/16 07/10/16  Milinda Pointer, MD  oxyCODONE (ROXICODONE) 15 MG immediate release tablet Take 1 tablet (15 mg total) by mouth every 6 (six) hours as needed for pain. 07/10/16 08/09/16  Milinda Pointer, MD  senna-docusate (SENNA-S) 8.6-50 MG tablet 1 tablet at bedtime as needed.  02/22/14   Historical Provider, MD  TURMERIC PO Take 900 mg by mouth daily.    Historical Provider, MD      Allergies Morphine and related and Ciprofloxacin   Family History  Problem Relation Age of Onset  . Cancer Mother   . Diabetes Father   . Lung disease Father   . Parkinson's disease Father   . Hypertension Father     Social History Social History  Substance Use Topics  . Smoking status: Former Smoker    Packs/day: 2.50    Years: 50.00    Types: Cigarettes    Quit date: 08/16/2006  . Smokeless tobacco: Never Used  . Alcohol use No     Comment: former drinker 03/15/13    Review of Systems  Constitutional:   No fever or chills.  ENT:   No sore throat. No rhinorrhea. Cardiovascular:   No chest pain. Respiratory:   No dyspnea or cough. Gastrointestinal:   Negative for abdominal pain, vomiting and diarrhea.  Genitourinary:   Negative for dysuria or difficulty urinating. Musculoskeletal:   Negative for focal pain or swelling Neurological:  Negative for headaches 10-point ROS otherwise negative.  ____________________________________________   PHYSICAL EXAM:  VITAL SIGNS: ED Triage Vitals  Enc Vitals Group     BP 05/13/16 1700 (!) 76/43     Pulse Rate 05/13/16 1700 77     Resp 05/13/16 1700 11     Temp 05/13/16 1700 98 F (36.7 C)     Temp Source 05/13/16 1700 Oral     SpO2 05/13/16 1700 96 %     Weight 05/13/16 1700 170 lb (77.1 kg)     Height 05/13/16 1700 '5\' 7"'$  (1.702 m)     Head Circumference --      Peak Flow --      Pain Score 05/13/16 1701 0     Pain Loc --      Pain Edu? --      Excl. in [place]? --     Vital signs reviewed, nursing assessments reviewed.   Constitutional:   Alert and oriented, but describing visual hallucinations. Emaciated, jaundiced Eyes:   Bright scleral icterus. No conjunctival pallor. PERRL. EOMI.  No nystagmus. ENT   Head:   Normocephalic and atraumatic.   Nose:   No congestion/rhinnorhea. No septal hematoma   Mouth/Throat:   Dry mucous membranes, no pharyngeal erythema. No peritonsillar mass.    Neck:   No  stridor. No SubQ emphysema. No meningismus. Hematological/Lymphatic/Immunilogical:   No cervical lymphadenopathy. Cardiovascular:   RRR. Symmetric bilateral radial and DP pulses.  No murmurs.  Respiratory:   Normal respiratory effort without tachypnea nor retractions. Breath sounds are clear and equal bilaterally. No wheezes/rales/rhonchi. Gastrointestinal:   Soft and nontender. Non distended. There is no CVA tenderness.  No rebound, rigidity, or guarding. Genitourinary:   deferred Musculoskeletal:   Nontender with normal range of motion in all extremities. No joint effusions.  No lower extremity tenderness.  No edema. Neurologic:   Normal speech and language.  CN 2-10 normal. Motor grossly intact except for frequent resting asteryxis.. Skin:    Skin is warm, dry and intact. No rash noted.  No petechiae, purpura, or bullae.  ____________________________________________    LABS (pertinent positives/negatives) (all labs ordered are listed, but only abnormal results are displayed) Labs Reviewed  ACETAMINOPHEN LEVEL - Abnormal; Notable for the following:       Result Value   Acetaminophen (Tylenol), Serum <10 (*)    All other components within normal limits  COMPREHENSIVE METABOLIC PANEL - Abnormal; Notable for the following:    Sodium 130 (*)    Chloride 96 (*)    CO2 19 (*)    Glucose, Bld 121 (*)    BUN 46 (*)    Creatinine, Ser 4.75 (*)    Calcium 8.6 (*)    Albumin 2.4 (*)    AST 75 (*)    Alkaline Phosphatase 283 (*)    Total Bilirubin 20.0 (*)    GFR calc non Af Amer 11 (*)    GFR calc Af Amer 13 (*)    All other components within normal limits  LIPASE, BLOOD - Abnormal; Notable for the following:    Lipase 53 (*)    All other components within normal limits  CBC WITH DIFFERENTIAL/PLATELET - Abnormal; Notable for the following:    WBC 13.5 (*)    RBC 3.61 (*)    Hemoglobin 10.3 (*)    HCT 29.5 (*)    RDW 21.5 (*)    Neutro Abs 12.2 (*)    Lymphs Abs 0.4 (*)  All  other components within normal limits  AMMONIA - Abnormal; Notable for the following:    Ammonia 62 (*)    All other components within normal limits  BILIRUBIN, DIRECT - Abnormal; Notable for the following:    Bilirubin, Direct 13.2 (*)    All other components within normal limits  ETHANOL  SALICYLATE LEVEL  URINALYSIS COMPLETEWITH MICROSCOPIC ([facility] ONLY)  PROTIME-INR   ____________________________________________   EKG  Interpreted by me Sinus rhythm rate of 78, normal axis and intervals. Normal QRS ST segments and T waves.  ____________________________________________    RADIOLOGY  CT head unremarkable Chest x-ray unremarkable  ____________________________________________   PROCEDURES Procedures CRITICAL CARE Performed by: Joni Fears, Dmetrius Ambs   Total critical care time: 35 minutes  Critical care time was exclusive of separately billable procedures and treating other patients.  Critical care was necessary to treat or prevent imminent or life-threatening deterioration.  Critical care was time spent personally by me on the following activities: development of treatment plan with patient and/or surrogate as well as nursing, discussions with consultants, evaluation of patient's response to treatment, examination of patient, obtaining history from patient or surrogate, ordering and performing treatments and interventions, ordering and review of laboratory studies, ordering and review of radiographic studies, pulse oximetry and re-evaluation of patient's condition.  CENTRAL LINE Performed by: Joni Fears, Phoenix Riesen Consent: The procedure was performed in an emergent situation. Required items: required blood products, implants, devices, and special equipment available Patient identity confirmed: arm band and provided demographic data Time out: Immediately prior to procedure a "time out" was called to verify the correct patient, procedure, equipment, support staff and site/side  marked as required. Indications: vascular access Anesthesia: local infiltration Local anesthetic: lidocaine 1% with epinephrine Anesthetic total: 3 ml Patient sedated: no Preparation: skin prepped with 2% chlorhexidine Skin prep agent dried: skin prep agent completely dried prior to procedure Sterile barriers: all five maximum sterile barriers used - cap, mask, sterile gown, sterile gloves, and large sterile sheet Hand hygiene: hand hygiene performed prior to central venous catheter insertion  Location details: Right IJ vein   Catheter type: triple lumen Catheter size: 7 Fr Pre-procedure: landmarks identified Ultrasound guidance: Yes, continuous real-time visualization throughout procedure. Guidewire placement in the IJ confirmed with ultrasound  Successful placement: yes Post-procedure: line sutured and dressing applied Assessment: blood return through all parts, free fluid flow, placement verified by x-ray and no pneumothorax on x-ray Patient tolerance: Patient tolerated the procedure well with no immediate complications. l ____________________________________________   INITIAL IMPRESSION / ASSESSMENT AND PLAN / ED COURSE  Pertinent labs & imaging results that were available during my care of the patient were reviewed by me and considered in my medical decision making (see chart for details).  Patient presents with clinically obvious jaundice, asterixis, visual hallucinations all indicative of decompensated liver failure. No evidence of acute hemorrhage from coagulopathy. No evidence of sepsis. Blood pressure is low, about 70/40 which I suspect is due to hepatorenal syndrome. We'll give IV saline for now for resuscitation. Likely admission, probably we'll need to transfer back to [facility].  ----------------------------------------- 7:10 PM on 05/13/2016 ----------------------------------------- Results obtained. T bili 20. Creatinine 4.7. We'll give oral lactulose, continue IV fluids.  We'll contact [facility] transfer center for admission at [facility] for further specialist evaluation and continuity of care.   ----------------------------------------- 8:51 PM on 05/13/2016 -----------------------------------------  Case discussed with Dr. Laurette Schimke and Corinna Capra at [facility] who accepts for transfer. Central line placed. Patient provided consent, but also is exhibiting visual hallucinations  and confusion and so implied consent doctrine applies as well.     Clinical Course    ____________________________________________   FINAL CLINICAL IMPRESSION(S) / ED DIAGNOSES  Final diagnoses:  Liver failure without hepatic coma, unspecified chronicity (HCC)  Hepatorenal syndrome (HCC)  Acute renal failure, unspecified acute renal failure type [facility])       Portions of this note were generated with dragon dictation software. Dictation errors may occur despite best attempts at proofreading.    Carrie Mew, MD 05/13/16 [phone]

## 2016-05-13 NOTE — ED Notes (Signed)
Report given to [employer]. Gabriel Cirri, RN called to be informed that patient was being readied to leave. Pt stable prior to transport.

## 2016-05-13 NOTE — ED Notes (Signed)
Went to check on patient. Pt stated, "I'm okay, but my blue cart ran away." This RN asked what blue cart pt was talking about, pt stated, "You know, the blue cart that was here. It was right here with the stuff on it." RN explained to pt that there was no blue cart in the room at any point, pt then stated, "Oh. I must be confused or hallucinating or something." RN explained to pt that EMS had reported that he had been grabbing at things that weren't there during transport. Pt then stated, "Oh, well I've hallucinated before, so that makes sense. Great." Pt has no needs or complaints at this time.

## 2016-05-18 ENCOUNTER — Encounter: Payer: Medicare HMO | Admitting: Pain Medicine

## 2016-05-21 ENCOUNTER — Encounter: Payer: Medicare HMO | Admitting: Pain Medicine

## 2016-06-15 DEATH — deceased

## 2016-08-06 ENCOUNTER — Ambulatory Visit: Payer: Medicare HMO | Admitting: Pain Medicine

## 2017-02-10 IMAGING — DX DG CHEST 1V PORT
1 series · 1 of 1 positions shown · non-contrast
Comparison: 05/13/2016, 06/21/2014

CLINICAL DATA: Fall with dizziness decreased p.o. uptake

EXAM:
PORTABLE CHEST 1 VIEW

[chest ap]
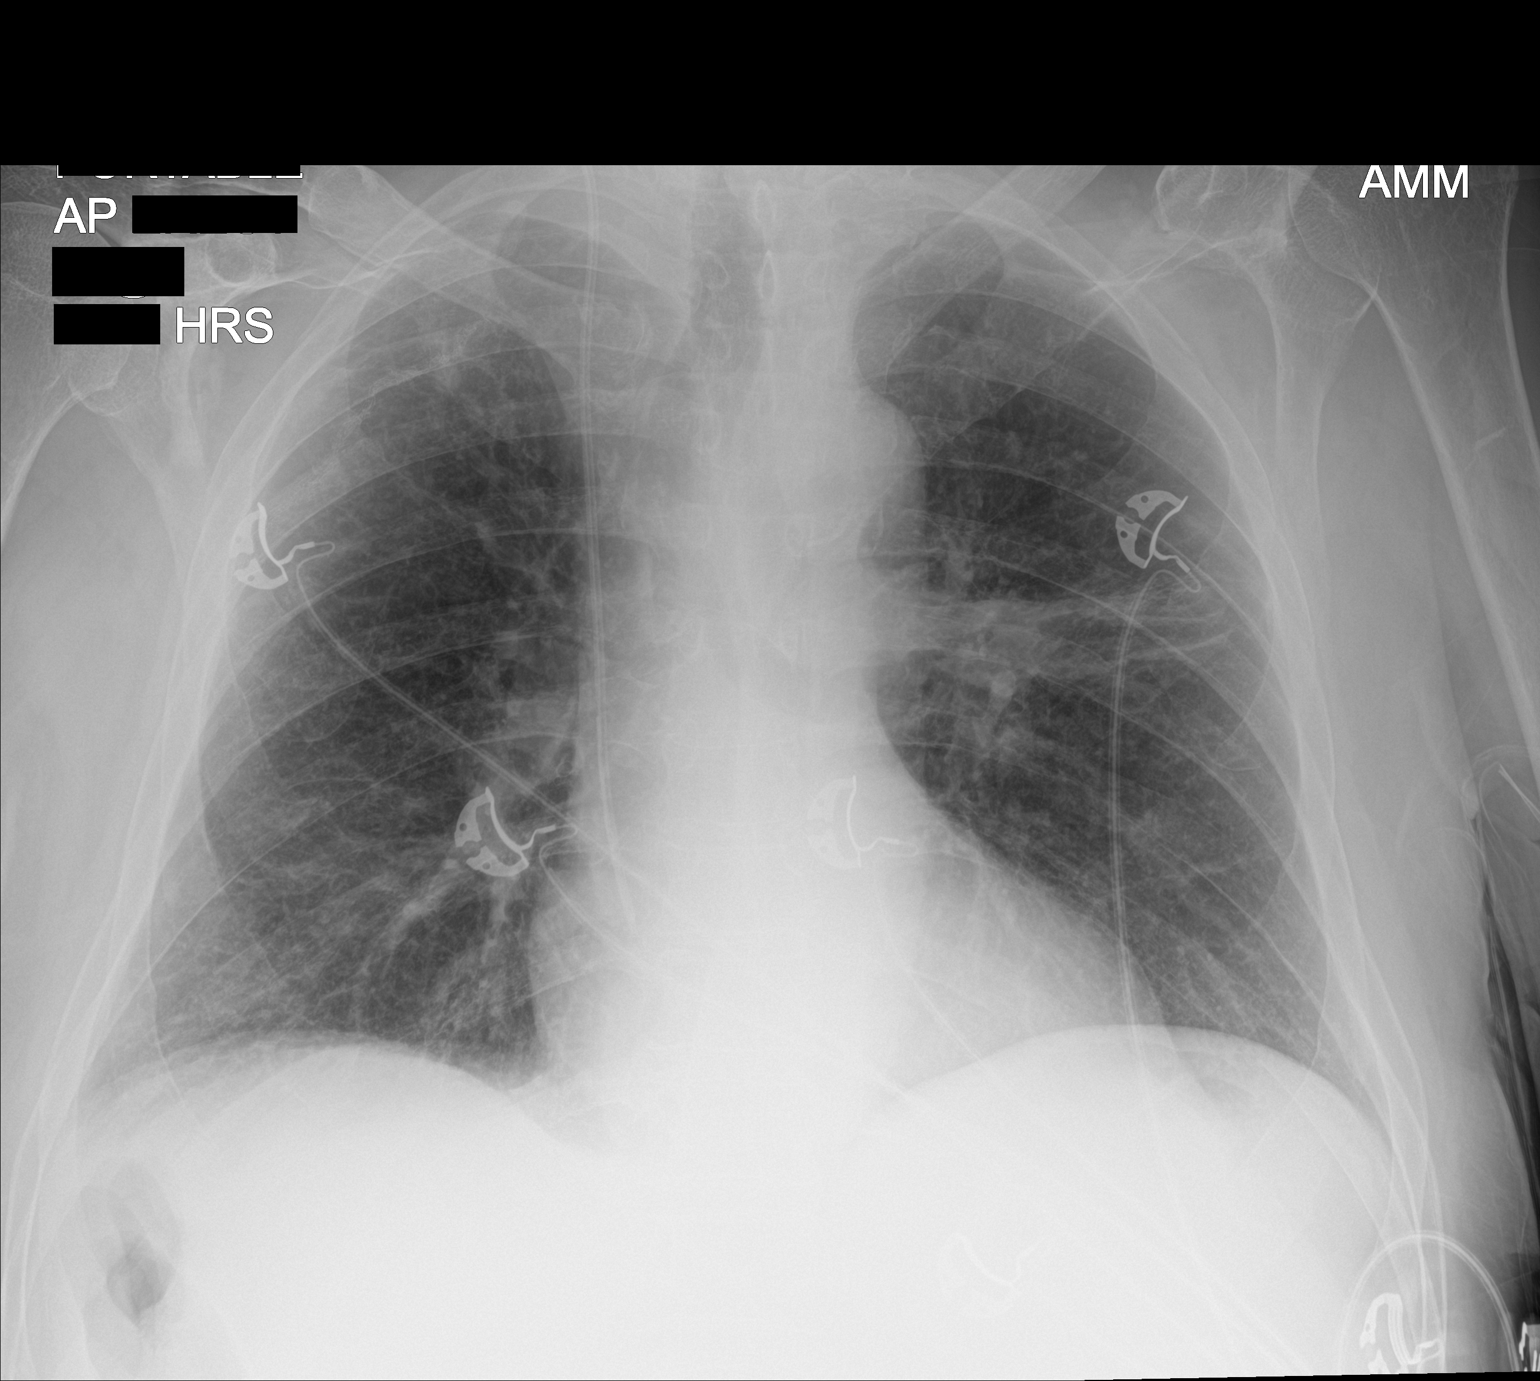

[1 of 1 positions shown; findings below may reference images not displayed]

FINDINGS: Postsurgical changes in the right upper lobe. Stable linear opacity
with more oval confluent opacity in the left hilar area, oval
opacity appears increased compared to the prior exam from today but
similar compared with June 2014. Right-sided central venous
catheter tip overlies the proximal right atrium. No pneumothorax.
Stable heart size. Atherosclerosis. Possible 9 mm nodule in the
right upper lobe. Old right clavicle fracture.
IMPRESSION: 1. Postsurgical changes of the right upper lobe. Possible 9 mm
pulmonary nodule in this region. CT follow-up is suggested.
2. Linear scarring in the left mid lung zone with slight increased
oval opacity at the left hilum, given rapidity of development, this
may represent atelectasis.
